# Patient Record
Sex: Female | Born: 1980 | Race: Black or African American | Hispanic: No | Marital: Single | Smoking: Current some day smoker
Health system: Southern US, Community
[De-identification: ages and names within clinical notes are randomized; demographics above are authoritative.]

## PROBLEM LIST (undated history)

## (undated) ENCOUNTER — Emergency Department (HOSPITAL_COMMUNITY): Admission: EM | Payer: Medicaid Other | Source: Home / Self Care

## (undated) DIAGNOSIS — F419 Anxiety disorder, unspecified: Secondary | ICD-10-CM

## (undated) DIAGNOSIS — F32A Depression, unspecified: Secondary | ICD-10-CM

## (undated) DIAGNOSIS — D219 Benign neoplasm of connective and other soft tissue, unspecified: Secondary | ICD-10-CM

## (undated) DIAGNOSIS — D649 Anemia, unspecified: Secondary | ICD-10-CM

## (undated) DIAGNOSIS — O009 Unspecified ectopic pregnancy without intrauterine pregnancy: Secondary | ICD-10-CM

## (undated) DIAGNOSIS — F319 Bipolar disorder, unspecified: Secondary | ICD-10-CM

## (undated) DIAGNOSIS — R519 Headache, unspecified: Secondary | ICD-10-CM

## (undated) HISTORY — DX: Benign neoplasm of connective and other soft tissue, unspecified: D21.9

## (undated) HISTORY — PX: BUNIONECTOMY: SHX129

---

## 2004-08-26 ENCOUNTER — Inpatient Hospital Stay: Payer: Self-pay

## 2005-01-24 ENCOUNTER — Emergency Department: Payer: Self-pay | Admitting: Emergency Medicine

## 2005-05-25 ENCOUNTER — Emergency Department: Payer: Self-pay | Admitting: Internal Medicine

## 2006-11-29 ENCOUNTER — Emergency Department: Payer: Self-pay | Admitting: Emergency Medicine

## 2007-02-27 ENCOUNTER — Encounter: Payer: Self-pay | Admitting: Maternal & Fetal Medicine

## 2007-03-27 ENCOUNTER — Encounter: Payer: Self-pay | Admitting: Obstetrics and Gynecology

## 2007-05-25 ENCOUNTER — Observation Stay: Payer: Self-pay

## 2007-06-25 ENCOUNTER — Observation Stay: Payer: Self-pay

## 2007-07-01 ENCOUNTER — Inpatient Hospital Stay: Payer: Self-pay

## 2007-08-23 ENCOUNTER — Emergency Department: Payer: Self-pay | Admitting: Internal Medicine

## 2008-01-25 ENCOUNTER — Emergency Department: Payer: Self-pay | Admitting: Emergency Medicine

## 2008-01-28 ENCOUNTER — Emergency Department: Payer: Self-pay | Admitting: Emergency Medicine

## 2008-11-22 ENCOUNTER — Emergency Department: Payer: Self-pay | Admitting: Emergency Medicine

## 2009-01-10 ENCOUNTER — Encounter: Payer: Self-pay | Admitting: Maternal & Fetal Medicine

## 2009-02-08 ENCOUNTER — Encounter: Payer: Self-pay | Admitting: Pediatric Cardiology

## 2009-02-21 ENCOUNTER — Encounter: Payer: Self-pay | Admitting: Maternal and Fetal Medicine

## 2009-03-20 ENCOUNTER — Observation Stay: Payer: Self-pay

## 2009-04-12 ENCOUNTER — Observation Stay: Payer: Self-pay | Admitting: Obstetrics & Gynecology

## 2009-05-02 ENCOUNTER — Inpatient Hospital Stay: Payer: Self-pay | Admitting: Obstetrics & Gynecology

## 2009-05-12 ENCOUNTER — Emergency Department: Payer: Self-pay | Admitting: Emergency Medicine

## 2009-05-17 ENCOUNTER — Emergency Department: Payer: Self-pay | Admitting: Emergency Medicine

## 2010-12-30 ENCOUNTER — Emergency Department: Payer: Self-pay | Admitting: Emergency Medicine

## 2011-02-05 ENCOUNTER — Encounter: Payer: Self-pay | Admitting: Maternal & Fetal Medicine

## 2011-03-23 ENCOUNTER — Observation Stay: Payer: Self-pay | Admitting: Obstetrics and Gynecology

## 2011-04-18 ENCOUNTER — Ambulatory Visit: Payer: Self-pay | Admitting: Oncology

## 2011-05-02 ENCOUNTER — Ambulatory Visit: Payer: Self-pay | Admitting: Oncology

## 2011-05-15 ENCOUNTER — Observation Stay: Payer: Self-pay

## 2011-05-18 ENCOUNTER — Observation Stay: Payer: Self-pay | Admitting: Obstetrics and Gynecology

## 2011-05-18 ENCOUNTER — Inpatient Hospital Stay: Payer: Self-pay

## 2011-05-19 ENCOUNTER — Ambulatory Visit: Payer: Self-pay | Admitting: Oncology

## 2012-04-26 ENCOUNTER — Emergency Department: Payer: Self-pay | Admitting: Internal Medicine

## 2012-04-26 LAB — CBC
MCHC: 32.9 g/dL (ref 32.0–36.0)
MCV: 85 fL (ref 80–100)
Platelet: 322 10*3/uL (ref 150–440)
RDW: 15.2 % — ABNORMAL HIGH (ref 11.5–14.5)
WBC: 7.8 10*3/uL (ref 3.6–11.0)

## 2012-04-26 LAB — WET PREP, GENITAL

## 2013-06-04 ENCOUNTER — Emergency Department: Payer: Self-pay | Admitting: Emergency Medicine

## 2013-06-04 LAB — COMPREHENSIVE METABOLIC PANEL
Albumin: 2.3 g/dL — ABNORMAL LOW (ref 3.4–5.0)
Anion Gap: 7 (ref 7–16)
BUN: 3 mg/dL — ABNORMAL LOW (ref 7–18)
Calcium, Total: 8 mg/dL — ABNORMAL LOW (ref 8.5–10.1)
Chloride: 108 mmol/L — ABNORMAL HIGH (ref 98–107)
EGFR (African American): >60
EGFR (Non-African Amer.): >60
Glucose: 86 mg/dL (ref 65–99)
Potassium: 2.9 mmol/L — ABNORMAL LOW (ref 3.5–5.1)

## 2013-06-04 LAB — CBC
HCT: 23.6 % — ABNORMAL LOW (ref 35.0–47.0)
HGB: 7 g/dL — ABNORMAL LOW (ref 12.0–16.0)
MCH: 21.4 pg — ABNORMAL LOW (ref 26.0–34.0)
MCHC: 29.9 g/dL — ABNORMAL LOW (ref 32.0–36.0)
MCV: 72 fL — ABNORMAL LOW (ref 80–100)
MCV: 72 fL — ABNORMAL LOW (ref 80–100)
Platelet: 321 10*3/uL (ref 150–440)
Platelet: 317 10*3/uL (ref 150–440)
RBC: 3.29 10*6/uL — ABNORMAL LOW (ref 3.80–5.20)
RDW: 18.3 % — ABNORMAL HIGH (ref 11.5–14.5)
RDW: 18.2 % — ABNORMAL HIGH (ref 11.5–14.5)

## 2013-06-04 LAB — HCG, QUANTITATIVE, PREGNANCY: Beta Hcg, Quant.: 10582 m[IU]/mL — ABNORMAL HIGH

## 2013-06-08 ENCOUNTER — Encounter: Payer: Self-pay | Admitting: Obstetrics and Gynecology

## 2013-06-19 ENCOUNTER — Ambulatory Visit: Payer: Self-pay | Admitting: Oncology

## 2013-06-29 ENCOUNTER — Ambulatory Visit: Payer: Self-pay | Admitting: Oncology

## 2013-07-13 ENCOUNTER — Observation Stay: Payer: Self-pay | Admitting: Advanced Practice Midwife

## 2013-07-18 ENCOUNTER — Ambulatory Visit: Payer: Self-pay | Admitting: Oncology

## 2013-08-31 ENCOUNTER — Inpatient Hospital Stay: Payer: Self-pay

## 2013-08-31 LAB — CBC WITH DIFFERENTIAL/PLATELET
Basophil %: 0.4 %
HCT: 36.7 % (ref 35.0–47.0)
Lymphocyte #: 1.7 10*3/uL (ref 1.0–3.6)
Lymphocyte %: 28.6 %
MCHC: 32 g/dL (ref 32.0–36.0)
MCV: 86 fL (ref 80–100)
Monocyte %: 10.2 %
Neutrophil #: 3.6 10*3/uL (ref 1.4–6.5)
Platelet: 227 10*3/uL (ref 150–440)
RDW: 17.4 % — ABNORMAL HIGH (ref 11.5–14.5)

## 2013-08-31 LAB — GC/CHLAMYDIA PROBE AMP

## 2013-09-01 LAB — HEMATOCRIT: HCT: 34 % — ABNORMAL LOW (ref 35.0–47.0)

## 2014-05-11 ENCOUNTER — Ambulatory Visit: Payer: Self-pay | Admitting: Obstetrics and Gynecology

## 2014-05-11 LAB — BASIC METABOLIC PANEL
ANION GAP: 5 — AB (ref 7–16)
BUN: 8 mg/dL (ref 7–18)
CALCIUM: 8.1 mg/dL — AB (ref 8.5–10.1)
CHLORIDE: 109 mmol/L — AB (ref 98–107)
Co2: 30 mmol/L (ref 21–32)
Creatinine: 0.76 mg/dL (ref 0.60–1.30)
EGFR (African American): >60
GLUCOSE: 78 mg/dL (ref 65–99)
Osmolality: 284 (ref 275–301)
POTASSIUM: 3.6 mmol/L (ref 3.5–5.1)
SODIUM: 144 mmol/L (ref 136–145)

## 2014-05-11 LAB — HEMOGLOBIN: HGB: 11.7 g/dL — AB (ref 12.0–16.0)

## 2015-01-07 NOTE — Consult Note (Signed)
Referral Information:  Reason for Referral 34 yo [place] [id number] AAF referred to [facility] perinatal due to grand multiparity, anemia , late care, h/o LEEP in March , h/o bipolar disorder off meds   Referring Physician [facility]   Prenatal Hx Ms Batterson underwent her first dating scan today suggesting she is 77 1/7 weeks. Pt reports she underwent a LEEP on 12/12/2012 and her pregnancy test was negative  and she abstained from sex after the LEEP. She believes she must have conceived immediately prior to the LEEP. She is embarrassed that she was drinking up through May but stopped shen she had suspicions that she might be pregnant by early June . She presented for care at the [facility] in late august/early september. She is concerned about her anemia  and started oral iron which caused nausea which took her to the ER early this month . She thinks her mood is stable - she received ehr bipolar diagnosis in 2010 and found the meds made her too sleepy . She and her partner think she is doing well andshe has suppor tfrom her mother - the kids are in school and the youngest are in daycare. She has received the name of a mental health provider but has not seen anyone in years.   Past Obstetrical Hx 2004 svd female 7 lb 2005 svd female 7 pounds 2008 spontaneous vaginal delivery 6 14 female  2010 female 7 lbs spontaneous vaginal delivery 2012 3380 gm spontaneous vaginal delivery female  2013 sab 3 months no D&C  last 3 pregnancies current partner   Home Medications: Medication Instructions Status  ferrous sulfate 325 mg (65 mg elemental iron) oral tablet 1 tab(s) orally once a day Active   Allergies:   No Known Allergies:   Vital Signs/Notes:  Nursing Vital Signs: **Vital Signs.:   22-Sep-14 13:10  Vital Signs Type Routine; Perinatal clinic  Temperature Temperature (F) 97.9  Celsius 36.6  Pulse Pulse 94  Respirations Respirations 18  Systolic BP Systolic BP 557  Diastolic BP (mmHg) Diastolic BP (mmHg) 59  Mean BP 72   Pulse Ox % Pulse Ox % 99  Oxygen Delivery Room Air/ 21 %   Perinatal Consult:  PGyn Hx abnormal Paps  LEEP  cin 2   Past Medical History cont'd Anemia - also in last pregnancy - HGB 7.1/24% ferritin 11 TIBC 496 iron 15 now tolerating oral iron, h/o pica , neg sickle screen Bipolar - no meds never hospitalized, no h/o suicide gesture , unclear if worse postpartum for her , no current provider  ETOH - pt denies current use as does father of baby - thoguh she suggested she stopped in may or june ,[facility] chart says had a drink in August neg tox screen , distant Marijuana use   PSurg Hx LEEP, bunionectomy   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- usually volunteers at [employer] too weak now   Occupation Father industrial work   Soc Hx committed relationship   Review Of Systems:  Subjective tired   Medications/Allergies Reviewed Medications/Allergies reviewed   Impression/Recommendations:  Impression IUP at 28 1/7 weeks dated by today's scan  1- late care 2 grand multip all live with her, support form partner and her mom, increased risk of PPH 3 anemia - iron deficiency on oral iron 4 h/o bipolar no meds , no ongoing provider , denies any suicidal thoughts now or ever, meds make her sleepy unable to care for her kids  5 ethanol exposure declines genetic counseling - denies ongoing  problem 6 recent LEEP for CIN 2   Recommendations 1 Follow up scan in 4 weeks to assess growth given late care 2 wants Nexplanon for contraception - not ready for BTL 3 I recommended her PNV plus 2 additional iron tablets , iron rich foods - advised colace  to prevent constipation, if unable to bring up Hgb consider iv iron 4- if still anemic at delivery consider active management of third stage with oxytocin and cytotec per rectum after placental delivery, type and cross 2 units PRBCs  in active labor 5- needs f/u pap  6 Offer SW and mental health referral - seems well compensated now, would accept advice of  her family if they suggested she needed help   Plan:  Genetic Counseling no, offered due to ETOH   Prenatal Diagnosis Options u/s done   Ultrasound at what gestational ages in 4 weeks   Additional Testing Folate/prenatal vitamins, plus iron bid    Total Time Spent with Patient 15 minutes   >50% of visit spent in couseling/coordination of care yes   Office Use Only [address]  Level 1 (73min) NEW office consult prob focused   Coding Description: MATERNAL CONDITIONS/HISTORY INDICATION(S).   Anemia.   Drug Use:  Alcohol or Cocaine.   OTHER: bipolar.  Electronic Signatures: Sharyn Creamer (MD)  (Signed 22-Sep-14 16:38)  Authored: Referral, Home Medications, Allergies, Vital Signs/Notes, Consult, Exam, Impression, Plan, Billing, Coding Description   Last Updated: 22-Sep-14 16:38 by Sharyn Creamer (MD)

## 2015-01-25 NOTE — H&P (Signed)
L&D Evaluation:  History Expanded:  HPI [facility] patuient who is seeing [facility] for problems in pregnancy and history 34 yo at 72 w 1 days who presented woth contractions last night. she is 7 cm. she is using ETOH and is a grandmultip, she has PICA and she has severe anemeiaher pap smear shows CIS this pregnancy, she had a referrla to [facility] 06/10/13/  but no results awre available, due to severe anemia [facility] had recommended active management of third stage of labor  with immediate start of pitocin after clamping cord and rectal cytotec at that time also, shse is GBS neg, got tdap on 06/30/13 has had LEEP on 12/12/12 with pos margins, late entry to care, andhas psych issues, she is O POS, RUBI, VZI  with normal OGTT   Gravida 7   Term 5   PreTerm 0   Abortion 1   Living 5   Blood Type (Maternal) O positive   Group B Strep Results Maternal (Result >5wks must be treated as unknown) negative   Maternal HIV Negative   Maternal Syphilis Ab Nonreactive   Maternal Varicella Immune   Rubella Results (Maternal) immune   Maternal T-Dap Immune   EDC 30-Aug-2013   Presents with contractions, labor, nitrazine neg but possible hair on exam.   Patient's Medical History CIS of cervix, etahism, PICA, anemia, psych issues,   Medications Pre Natal Vitamins   Allergies NKDA   Family History Non-Contributory   Current Prenatal Course Notable For late entry to care, etohism, psych issues,   ROS:  ROS All systems were reviewed.  HEENT, CNS, GI, GU, Respiratory, CV, Renal and Musculoskeletal systems were found to be normal.   Exam:  Vital Signs stable   Urine Protein not completed   General no apparent distress   Mental Status clear   Chest clear   Heart normal sinus rhythm   Abdomen gravid, tender with contractions   Estimated Fetal Weight Average for gestational age   Fetal Position vertex   Back no CVAT   Edema no edema   Reflexes 1+   Pelvic no external lesions    Mebranes Intact, but truly unsure   Description clear   FHT normal rate with no decels   Fetal Heart Rate 140   Ucx regular   Ucx Frequency 2 min   Skin dry   Lymph no lymphadenopathy   Impression:  Impression active labor   Plan:  Plan UA   Comments if suspect rupture will add antibiotics and anticipate SVD   Follow Up Appointment need to schedule. in 4 weeks   Electronic Signatures: Erik Obey (MD)  (Signed 15-Dec-14 08:04)  Authored: L&D Evaluation   Last Updated: 15-Dec-14 08:04 by Erik Obey (MD)
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
## 2015-05-13 ENCOUNTER — Emergency Department
Admission: EM | Admit: 2015-05-13 | Discharge: 2015-05-13 | Disposition: A | Discharge status: Home / Self Care | Payer: Self-pay | Attending: Emergency Medicine | Admitting: Emergency Medicine

## 2015-05-13 DIAGNOSIS — Y9289 Other specified places as the place of occurrence of the external cause: Secondary | ICD-10-CM | POA: Insufficient documentation

## 2015-05-13 DIAGNOSIS — W57XXXA Bitten or stung by nonvenomous insect and other nonvenomous arthropods, initial encounter: Secondary | ICD-10-CM | POA: Insufficient documentation

## 2015-05-13 DIAGNOSIS — Y998 Other external cause status: Secondary | ICD-10-CM | POA: Insufficient documentation

## 2015-05-13 DIAGNOSIS — S80862A Insect bite (nonvenomous), left lower leg, initial encounter: Secondary | ICD-10-CM | POA: Insufficient documentation

## 2015-05-13 DIAGNOSIS — L301 Dyshidrosis [pompholyx]: Secondary | ICD-10-CM | POA: Insufficient documentation

## 2015-05-13 DIAGNOSIS — Z87891 Personal history of nicotine dependence: Secondary | ICD-10-CM | POA: Insufficient documentation

## 2015-05-13 DIAGNOSIS — Y9389 Activity, other specified: Secondary | ICD-10-CM | POA: Insufficient documentation

## 2015-05-13 DIAGNOSIS — L089 Local infection of the skin and subcutaneous tissue, unspecified: Secondary | ICD-10-CM

## 2015-05-13 HISTORY — DX: Anemia, unspecified: D64.9

## 2015-05-13 MED ORDER — IBUPROFEN 800 MG PO TABS: 800.0000 mg | ORAL_TABLET | Freq: Three times a day (TID) | ORAL | Status: DC | PRN

## 2015-05-13 MED ORDER — HYDROCORTISONE VALERATE 0.2 % EX OINT: 1.0000 "application " | TOPICAL_OINTMENT | Freq: Two times a day (BID) | CUTANEOUS | Status: DC

## 2015-05-13 MED ORDER — SULFAMETHOXAZOLE-TRIMETHOPRIM 800-160 MG PO TABS: 1.0000 | ORAL_TABLET | Freq: Two times a day (BID) | ORAL | Status: DC

## 2015-05-13 NOTE — ED Notes (Signed)
Both lower legs swollen with redness   States she noticed an insect bite on Tuesday.

## 2015-05-13 NOTE — ED Provider Notes (Signed)
[facility] Emergency Department Provider Note  ____________________________________________  Time seen: Approximately 12:58 PM  I have reviewed the triage vital signs and the nursing notes.   HISTORY  Chief Complaint Insect Bite    HPI Lindsey Hunt is a 34 y.o. female patient complaining of erythema edema to bilateral legs status post insect bite which happened 3 days ago. Patient state that since the bite she been trying to express any purulent material out of the sites. Patient denies any fever associated with this complaint. Patient rated her discomfort as a 9/10. Patient also complaining of a rash to the palmar aspects of her hands. Patient states she's had these episodes on her hands in the past and also her feet.  Past Medical History  Diagnosis Date  . Anemia     There are no active problems to display for this patient.   Past Surgical History  Procedure Laterality Date  . Bunionectomy Left     Current Outpatient Rx  Name  Route  Sig  Dispense  Refill  . ferrous sulfate 325 (65 FE) MG tablet   Oral   Take 325 mg by mouth daily with breakfast.           Allergies Review of patient's allergies indicates no known allergies.  No family history on file.  Social History Social History  Substance Use Topics  . Smoking status: Former Research scientist (life sciences)  . Smokeless tobacco: Never Used  . Alcohol Use: No    Review of Systems Constitutional: No fever/chills Eyes: No visual changes. ENT: No sore throat. Cardiovascular: Denies chest pain. Respiratory: Denies shortness of breath. Gastrointestinal: No abdominal pain.  No nausea, no vomiting.  No diarrhea.  No constipation. Genitourinary: Negative for dysuria. Musculoskeletal: Negative for back pain. Skin: Rash on hands and feet. Erythematous bilateral lower legs. Neurological: Negative for headaches, focal weakness or numbness. Hematological/Lymphatic:Anemia 10-point ROS otherwise  negative.  ____________________________________________   PHYSICAL EXAM:  VITAL SIGNS: ED Triage Vitals  Enc Vitals Group     BP 05/13/15 1239 117/60 mmHg     Pulse Rate 05/13/15 1239 87     Resp 05/13/15 1239 17     Temp 05/13/15 1239 98.2 F (36.8 C)     Temp Source 05/13/15 1239 Oral     SpO2 05/13/15 1239 99 %     Weight 05/13/15 1239 182 lb (82.555 kg)     Height 05/13/15 1239 5\' 6"  (1.676 m)     Head Cir --      Peak Flow --      Pain Score 05/13/15 1240 9     Pain Loc --      Pain Edu? --      Excl. in [place]? --     Constitutional: Alert and oriented. Well appearing and in no acute distress. Eyes: Conjunctivae are normal. PERRL. EOMI. Head: Atraumatic. Nose: No congestion/rhinnorhea. Mouth/Throat: Mucous membranes are moist.  Oropharynx non-erythematous. Neck: No stridor. No cervical spine tenderness to palpation. Hematological/Lymphatic/Immunilogical: No cervical lymphadenopathy. Cardiovascular: Normal rate, regular rhythm. Grossly normal heart sounds.  Good peripheral circulation. Respiratory: Normal respiratory effort.  No retractions. Lungs CTAB. Gastrointestinal: Soft and nontender. No distention. No abdominal bruits. No CVA tenderness. Musculoskeletal: No lower extremity tenderness nor edema.  No joint effusions. Neurologic:  Normal speech and language. No gross focal neurologic deficits are appreciated. No gait instability. Skin:  Skin is warm, dry and intact. Papular lesion on erythematous base lateral legs. Eczema lesions on the hands and feet.  Psychiatric: Mood and affect are normal. Speech and behavior are normal.  ____________________________________________   LABS (all labs ordered are listed, but only abnormal results are displayed)  Labs Reviewed - No data to  display ____________________________________________  EKG   ____________________________________________  RADIOLOGY   ____________________________________________   PROCEDURES  Procedure(s) performed: None  Critical Care performed: No  ____________________________________________   INITIAL IMPRESSION / ASSESSMENT AND PLAN / ED COURSE  Pertinent labs & imaging results that were available during my care of the patient were reviewed by me and considered in my medical decision making (see chart for details).  Infected insect bite bilateral legs. Dyshidrotic eczema of hands and feet. Patient given prescription for Bactrim and ibuprofen to take as directed. Patient also given a prescription for Westcort applied to the eczema lesions. [facility]. ____________________________________________   FINAL CLINICAL IMPRESSION(S) / ED DIAGNOSES  Final diagnoses:  Infected insect bite of left leg, initial encounter  Dyshidrotic eczema      Sable Feil, PA-C 05/13/15 1316  Ahmed Prima, MD 05/13/15 [phone]

## 2015-05-13 NOTE — ED Notes (Signed)
Pt c/o redness and swelling of the BL LE since Wednesday..she thinks something bit her

## 2015-10-23 ENCOUNTER — Emergency Department
Admission: EM | Admit: 2015-10-23 | Discharge: 2015-10-23 | Disposition: A | Discharge status: Home / Self Care | Payer: Medicaid Other | Attending: Emergency Medicine | Admitting: Emergency Medicine

## 2015-10-23 DIAGNOSIS — R222 Localized swelling, mass and lump, trunk: Secondary | ICD-10-CM | POA: Diagnosis present

## 2015-10-23 DIAGNOSIS — Z79899 Other long term (current) drug therapy: Secondary | ICD-10-CM | POA: Diagnosis not present

## 2015-10-23 DIAGNOSIS — Z87891 Personal history of nicotine dependence: Secondary | ICD-10-CM | POA: Insufficient documentation

## 2015-10-23 DIAGNOSIS — L02212 Cutaneous abscess of back [any part, except buttock]: Secondary | ICD-10-CM

## 2015-10-23 MED ORDER — HYDROCODONE-ACETAMINOPHEN 5-325 MG PO TABS: 1.0000 | ORAL_TABLET | ORAL | Status: DC | PRN

## 2015-10-23 MED ORDER — LIDOCAINE-EPINEPHRINE (PF) 1 %-1:200000 IJ SOLN
INTRAMUSCULAR | Status: AC
  Filled 2015-10-23: qty 30

## 2015-10-23 MED ORDER — SULFAMETHOXAZOLE-TRIMETHOPRIM 800-160 MG PO TABS: 2.0000 | ORAL_TABLET | Freq: Two times a day (BID) | ORAL | Status: AC

## 2015-10-23 NOTE — ED Provider Notes (Signed)
CSN: [id number]     Arrival date & time 10/23/15  1405 History   First MD Initiated Contact with Patient 10/23/15 1425     Chief Complaint  Patient presents with  . Abscess    HPI Comments: 35 year old female presents today complaining of red, swollen area to left shoulder for the past 3-4 days. Pt reports it have become larger and more painful as time goes on. No fevers, sweats or chills. Has not applied any creams to it or tried to open the boil herself. Has had these in the past and was treated with incision and drainage. Has not been diagnosed with MRSA.   The history is provided by the patient.    Past Medical History  Diagnosis Date  . Anemia    Past Surgical History  Procedure Laterality Date  . Bunionectomy Left    No family history on file. Social History  Substance Use Topics  . Smoking status: Former Research scientist (life sciences)  . Smokeless tobacco: Never Used  . Alcohol Use: No   OB History    No data available     Review of Systems  Constitutional: Negative for fever and chills.  Musculoskeletal: Negative for myalgias and arthralgias.  Skin: Positive for wound.  All other systems reviewed and are negative.     Allergies  Review of patient's allergies indicates no known allergies.  Home Medications   Prior to Admission medications   Medication Sig Start Date End Date Taking? Authorizing Provider  ferrous sulfate 325 (65 FE) MG tablet Take 325 mg by mouth daily with breakfast.    Historical Provider, MD  HYDROcodone-acetaminophen (NORCO) 5-325 MG tablet Take 1 tablet by mouth every 4 (four) hours as needed for moderate pain. 10/23/15   Harvest Dark, PA-C  hydrocortisone valerate ointment (WESTCORT) 0.2 % Apply 1 application topically 2 (two) times daily. 05/13/15   Sable Feil, PA-C  sulfamethoxazole-trimethoprim (BACTRIM DS,SEPTRA DS) 800-160 MG tablet Take 2 tablets by mouth 2 (two) times daily. 10/23/15 11/02/15  Angelica Ran V, PA-C   BP 119/61 mmHg  Pulse 83   Temp(Src) 99 F (37.2 C) (Oral)  Resp 18  Ht 5\' 6"  (1.676 m)  Wt 83.915 kg  BMI 29.87 kg/m2  SpO2 100% Physical Exam  Constitutional: She is oriented to person, place, and time. Vital signs are normal. She appears well-developed and well-nourished. She is active.  Non-toxic appearance. She does not have a sickly appearance. She does not appear ill.  HENT:  Head: Normocephalic and atraumatic.  Neurological: She is alert and oriented to person, place, and time.  Skin: There is erythema.     Psychiatric: She has a normal mood and affect. Her behavior is normal. Judgment and thought content normal.  Nursing note and vitals reviewed.   ED Course  .Marland KitchenIncision and Drainage Date/Time: 10/23/2015 2:50 PM Performed by: Harvest Dark Authorized by: Harvest Dark Consent: Verbal consent obtained. Risks and benefits: risks, benefits and alternatives were discussed Consent given by: patient Patient understanding: patient states understanding of the procedure being performed Patient consent: the patient's understanding of the procedure matches consent given Procedure consent: procedure consent matches procedure scheduled Relevant documents: relevant documents present and verified Test results: test results available and properly labeled Site marked: the operative site was marked Imaging studies: imaging studies available Required items: required blood products, implants, devices, and special equipment available Patient identity confirmed: verbally with patient Time out: Immediately prior to procedure a "time out" was called to  verify the correct patient, procedure, equipment, support staff and site/side marked as required. Type: abscess Body area: trunk Location details: back Anesthesia: local infiltration Local anesthetic: lidocaine 1% with epinephrine Anesthetic total: 6 ml Patient sedated: no Scalpel size: 11 Incision type: elliptical Incision depth: dermal Complexity:  simple Drainage: purulent Drainage amount: copious Wound treatment: drain placed Packing material: 1/4 in iodoform gauze Patient tolerance: Patient tolerated the procedure well with no immediate complications   (including critical care time) Labs Review Labs Reviewed - No data to display  Imaging Review No results found. I have personally reviewed and evaluated these images and lab results as part of my medical decision-making.   EKG Interpretation None      MDM  WOund drained as above Bactrim DS 2 tabs PO BID x 10 days Norco 5/325 q4h PRN pain, do not take before working or driving Follow up in 2 days for wound check  Final diagnoses:  Abscess of upper back excluding scapular region        Harvest Dark, PA-C 10/23/15 [place], PA-C 10/23/15 1504  Carrie Mew, MD 10/23/15 1520

## 2015-10-23 NOTE — ED Notes (Signed)
Pt reports red swollen area to back that started Thursday. Denies drainage

## 2015-10-23 NOTE — Discharge Instructions (Signed)

## 2017-01-07 ENCOUNTER — Ambulatory Visit: Payer: Medicaid Other | Admitting: Family Medicine

## 2017-01-07 ENCOUNTER — Encounter: Payer: Self-pay | Admitting: Family Medicine

## 2017-03-28 ENCOUNTER — Encounter (HOSPITAL_COMMUNITY): Payer: Self-pay | Admitting: Emergency Medicine

## 2017-03-28 ENCOUNTER — Ambulatory Visit (HOSPITAL_COMMUNITY)
Admission: EM | Admit: 2017-03-28 | Discharge: 2017-03-28 | Disposition: A | Discharge status: Home / Self Care | Payer: Medicaid Other | Attending: Emergency Medicine | Admitting: Emergency Medicine

## 2017-03-28 DIAGNOSIS — R21 Rash and other nonspecific skin eruption: Secondary | ICD-10-CM

## 2017-03-28 DIAGNOSIS — L239 Allergic contact dermatitis, unspecified cause: Secondary | ICD-10-CM | POA: Diagnosis not present

## 2017-03-28 DIAGNOSIS — L259 Unspecified contact dermatitis, unspecified cause: Secondary | ICD-10-CM

## 2017-03-28 MED ORDER — HYDROXYZINE HCL 25 MG PO TABS: 25.0000 mg | ORAL_TABLET | Freq: Four times a day (QID) | ORAL | 0 refills | Status: DC

## 2017-03-28 MED ORDER — METHYLPREDNISOLONE 4 MG PO TBPK: ORAL_TABLET | ORAL | 0 refills | Status: DC

## 2017-03-28 NOTE — ED Provider Notes (Signed)
CSN: [id number]     Arrival date & time 03/28/17  1023 History   None    Chief Complaint  Patient presents with  . Rash   (Consider location/radiation/quality/duration/timing/severity/associated sxs/prior Treatment) Patient c/o itching and rash on left hand which started today.   The history is provided by the patient.  Rash  Location:  Hand Hand rash location:  L hand Quality: itchiness and redness   Severity:  Mild Onset quality:  Sudden Duration:  1 day Timing:  Constant Progression:  Spreading Chronicity:  New   Past Medical History:  Diagnosis Date  . Anemia    Past Surgical History:  Procedure Laterality Date  . BUNIONECTOMY Left    No family history on file. Social History  Substance Use Topics  . Smoking status: Former Research scientist (life sciences)  . Smokeless tobacco: Never Used  . Alcohol use No   OB History    No data available     Review of Systems  Constitutional: Negative.   HENT: Negative.   Eyes: Negative.   Respiratory: Negative.   Cardiovascular: Negative.   Gastrointestinal: Negative.   Endocrine: Negative.   Genitourinary: Negative.   Musculoskeletal: Negative.   Skin: Positive for rash.  Allergic/Immunologic: Negative.   Neurological: Negative.   Hematological: Negative.   Psychiatric/Behavioral: Negative.     Allergies  Patient has no known allergies.  Home Medications   Prior to Admission medications   Medication Sig Start Date End Date Taking? Authorizing Provider  OVER THE COUNTER MEDICATION Birth control pills (cannot remember the name)   Yes [provider]  ferrous sulfate 325 (65 FE) MG tablet Take 325 mg by mouth daily with breakfast.    [provider]  HYDROcodone-acetaminophen (NORCO) 5-325 MG tablet Take 1 tablet by mouth every 4 (four) hours as needed for moderate pain. 10/23/15   Harvest Dark, PA-C  hydrocortisone valerate ointment (WESTCORT) 0.2 % Apply 1 application topically 2 (two) times daily. 05/13/15    Sable Feil, PA-C  hydrOXYzine (ATARAX/VISTARIL) 25 MG tablet Take 1 tablet (25 mg total) by mouth every 6 (six) hours. 03/28/17   Lysbeth Penner, FNP  methylPREDNISolone (MEDROL DOSEPAK) 4 MG TBPK tablet Take 6-5-4-3-2-1- po qd 03/28/17   Lysbeth Penner, FNP   Meds Ordered and Administered this Visit  Medications - No data to display  BP 131/85 (BP Location: Left Arm)   Pulse 62   Temp 98.1 F (36.7 C) (Oral)   Resp 18   SpO2 99%  No data found.   Physical Exam  Constitutional: She appears well-developed and well-nourished.  HENT:  Head: Normocephalic and atraumatic.  Eyes: Pupils are equal, round, and reactive to light. Conjunctivae and EOM are normal.  Neck: Normal range of motion. Neck supple.  Cardiovascular: Normal rate, regular rhythm and normal heart sounds.   Pulmonary/Chest: Effort normal and breath sounds normal.  Abdominal: Soft. Bowel sounds are normal.  Skin: Rash noted.  Nursing note and vitals reviewed.   Urgent Care Course     Procedures (including critical care time)  Labs Review Labs Reviewed - No data to display  Imaging Review No results found.   Visual Acuity Review  Right Eye Distance:   Left Eye Distance:   Bilateral Distance:    Right Eye Near:   Left Eye Near:    Bilateral Near:         MDM   1. Contact dermatitis, unspecified contact dermatitis type, unspecified trigger    Medrol dose pack  as directed 4mg  #21 Hydroxyzine  Work note      Lysbeth Penner, [place] 03/28/17 1857

## 2017-03-28 NOTE — ED Triage Notes (Signed)
Noticed rash today.  Reports she noticed extra itching yesterday.  Left hand soreness

## 2018-11-17 ENCOUNTER — Emergency Department (HOSPITAL_COMMUNITY)
Admission: EM | Admit: 2018-11-17 | Discharge: 2018-11-18 | Disposition: A | Discharge status: Home / Self Care | Payer: Self-pay | Attending: Emergency Medicine | Admitting: Emergency Medicine

## 2018-11-17 ENCOUNTER — Other Ambulatory Visit: Payer: Self-pay

## 2018-11-17 ENCOUNTER — Encounter (HOSPITAL_COMMUNITY): Payer: Self-pay

## 2018-11-17 DIAGNOSIS — J0100 Acute maxillary sinusitis, unspecified: Secondary | ICD-10-CM | POA: Insufficient documentation

## 2018-11-17 DIAGNOSIS — Z87891 Personal history of nicotine dependence: Secondary | ICD-10-CM | POA: Insufficient documentation

## 2018-11-17 DIAGNOSIS — J069 Acute upper respiratory infection, unspecified: Secondary | ICD-10-CM | POA: Insufficient documentation

## 2018-11-17 DIAGNOSIS — R0789 Other chest pain: Secondary | ICD-10-CM | POA: Insufficient documentation

## 2018-11-17 DIAGNOSIS — R0981 Nasal congestion: Secondary | ICD-10-CM | POA: Insufficient documentation

## 2018-11-17 DIAGNOSIS — J3489 Other specified disorders of nose and nasal sinuses: Secondary | ICD-10-CM | POA: Insufficient documentation

## 2018-11-17 NOTE — ED Triage Notes (Signed)
Pt here with a cough for the last 4 days.  Non productive cough.  A&Ox4.

## 2018-11-18 MED ORDER — BENZONATATE 100 MG PO CAPS: 100.0000 mg | ORAL_CAPSULE | Freq: Three times a day (TID) | ORAL | 0 refills | Status: DC

## 2018-11-18 MED ORDER — FLUTICASONE PROPIONATE 50 MCG/ACT NA SUSP: 1.0000 | Freq: Every day | NASAL | 0 refills | Status: DC

## 2018-11-18 MED ORDER — NAPROXEN 500 MG PO TABS: 500.0000 mg | ORAL_TABLET | Freq: Two times a day (BID) | ORAL | 0 refills | Status: DC

## 2018-11-18 MED ORDER — PROMETHAZINE-DM 6.25-15 MG/5ML PO SYRP: 5.0000 mL | ORAL_SOLUTION | Freq: Four times a day (QID) | ORAL | 0 refills | Status: DC | PRN

## 2018-11-18 NOTE — Discharge Instructions (Addendum)
You likely have a viral illness.  This should be treated symptomatically. Take naproxen 2 times a day with meals as needed for pain.  Do not take other anti-inflammatories at the same time (Advil, Motrin, ibuprofen, Aleve). You may supplement with Tylenol if you need further pain control. Use Flonase daily for nasal congestion and cough. Use cough medications to help decrease cough.  Make sure you stay well-hydrated with water. Wash your hands frequently to prevent spread of infection. Return to the emergency room if you develop chest pain, difficulty breathing, or any new or worsening symptoms.

## 2018-11-18 NOTE — ED Provider Notes (Signed)
[facility] EMERGENCY DEPARTMENT Provider Note   CSN: [id number] Arrival date & time: 11/17/18  2302    History   Chief Complaint Chief Complaint  Patient presents with  . Cough    HPI Lindsey Hunt is a 38 y.o. female presenting for evaluation of cough, nasal congestion, and sinus pressure.  Patient states that the past 5 days, she has been having URI symptoms.  She reports nasal congestion and sinus pressure on the right side.  Patient reports a cough which is occasionally productive.  When she coughs, she reports chest pain that streaks of parts her chest.  No pain at rest.  She denies fevers, chills, ear pain, sore throat, shortness of breath, nausea, vomiting, dumping, urinary symptoms, normal bowel movements.  She denies history of asthma or COPD.  She denies tobacco use.  Patient states her nephew's fianc was recently sick with the same, no other sick contacts.  She has no medical problems, takes no medications daily.  She has been taking Alka-Seltzer and TheraFlu without improvement of symptoms.  She has not tried anything for her pain.     HPI  Past Medical History:  Diagnosis Date  . Anemia     There are no active problems to display for this patient.   Past Surgical History:  Procedure Laterality Date  . BUNIONECTOMY Left      OB History   No obstetric history on file.      Home Medications    Prior to Admission medications   Medication Sig Start Date End Date Taking? Authorizing Provider  benzonatate (TESSALON) 100 MG capsule Take 1 capsule (100 mg total) by mouth every 8 (eight) hours. 11/18/18   Berdena Cisek, PA-C  ferrous sulfate 325 (65 FE) MG tablet Take 325 mg by mouth daily with breakfast.    [provider]  fluticasone (FLONASE) 50 MCG/ACT nasal spray Place 1 spray into both nostrils daily. 11/18/18   Maebry Obrien, PA-C  HYDROcodone-acetaminophen (NORCO) 5-325 MG tablet Take 1 tablet by mouth every 4 (four)  hours as needed for moderate pain. 10/23/15   Harvest Dark, PA-C  hydrocortisone valerate ointment (WESTCORT) 0.2 % Apply 1 application topically 2 (two) times daily. 05/13/15   Sable Feil, PA-C  hydrOXYzine (ATARAX/VISTARIL) 25 MG tablet Take 1 tablet (25 mg total) by mouth every 6 (six) hours. 03/28/17   Lysbeth Penner, FNP  methylPREDNISolone (MEDROL DOSEPAK) 4 MG TBPK tablet Take 6-5-4-3-2-1- po qd 03/28/17   Lysbeth Penner, FNP  naproxen (NAPROSYN) 500 MG tablet Take 1 tablet (500 mg total) by mouth 2 (two) times daily with a meal. 11/18/18   Jannelly Bergren, PA-C  OVER THE COUNTER MEDICATION Birth control pills (cannot remember the name)    [provider]  promethazine-dextromethorphan (PROMETHAZINE-DM) 6.25-15 MG/5ML syrup Take 5 mLs by mouth 4 (four) times daily as needed. 11/18/18   Elga Santy, PA-C    Family History History reviewed. No pertinent family history.  Social History Social History   Tobacco Use  . Smoking status: Former Research scientist (life sciences)  . Smokeless tobacco: Never Used  Substance Use Topics  . Alcohol use: No  . Drug use: No     Allergies   Patient has no known allergies.   Review of Systems Review of Systems  HENT: Positive for congestion, sinus pressure and sinus pain.   Respiratory: Positive for cough.   All other systems reviewed and are negative.    Physical Exam Updated Vital Signs  BP (!) 124/91 (BP Location: Right Arm)   Pulse 61   Temp 97.9 F (36.6 C) (Oral)   SpO2 100%   Physical Exam Vitals signs and nursing note reviewed.  Constitutional:      General: She is not in acute distress.    Appearance: She is well-developed.     Comments: Sitting in the bed in NAD  HENT:     Head: Normocephalic and atraumatic.     Comments: OP clear without tonsillar swelling redness today.  Uvula midline with equal palate rise.  TMs nonerythematous nonbulging bilaterally. No dental pain Nasal congestion noted on the right side.   Tenderness palpation of the right maxillary sinus.    Right Ear: Tympanic membrane, ear canal and external ear normal.     Left Ear: Tympanic membrane, ear canal and external ear normal.     Nose: Mucosal edema present.     Right Sinus: Maxillary sinus tenderness present. No frontal sinus tenderness.     Left Sinus: No maxillary sinus tenderness or frontal sinus tenderness.     Mouth/Throat:     Pharynx: Uvula midline.     Tonsils: No tonsillar exudate.  Eyes:     Conjunctiva/sclera: Conjunctivae normal.     Pupils: Pupils are equal, round, and reactive to light.  Neck:     Musculoskeletal: Normal range of motion.  Cardiovascular:     Rate and Rhythm: Normal rate and regular rhythm.     Pulses: Normal pulses.  Pulmonary:     Effort: Pulmonary effort is normal.     Breath sounds: Normal breath sounds. No decreased breath sounds, wheezing, rhonchi or rales.     Comments: Speaking in full sentences.  Clear lung sounds in all fields. Abdominal:     General: There is no distension.     Palpations: Abdomen is soft. There is no mass.     Tenderness: There is no abdominal tenderness. There is no guarding or rebound.  Musculoskeletal: Normal range of motion.  Lymphadenopathy:     Cervical: No cervical adenopathy.  Skin:    General: Skin is warm.     Capillary Refill: Capillary refill takes less than 2 seconds.  Neurological:     Mental Status: She is alert and oriented to person, place, and time.      ED Treatments / Results  Labs (all labs ordered are listed, but only abnormal results are displayed) Labs Reviewed - No data to display  EKG None  Radiology No results found.  Procedures Procedures (including critical care time)  Medications Ordered in ED Medications - No data to display   Initial Impression / Assessment and Plan / ED Course  I have reviewed the triage vital signs and the nursing notes.  Pertinent labs & imaging results that were available during my care  of the patient were reviewed by me and considered in my medical decision making (see chart for details).        Patient presenting with 5 day h/o URI symptoms.  Physical exam reassuring, patient is afebrile and appears nontoxic.  Pulmonary exam reassuring.  Doubt pneumonia, strep, other bacterial infection, or peritonsillar abscess.  Likely viral URI, consider sinusitis.  Will treat symptomatically.  Patient to follow-up as needed.  At this time, patient appears safe for discharge.  Return precautions given.  Patient states she understands and agrees to plan.  Final Clinical Impressions(s) / ED Diagnoses   Final diagnoses:  Upper respiratory tract infection, unspecified type  Acute maxillary  sinusitis, recurrence not specified    ED Discharge Orders         Ordered    benzonatate (TESSALON) 100 MG capsule  Every 8 hours     11/18/18 0356    naproxen (NAPROSYN) 500 MG tablet  2 times daily with meals     11/18/18 0356    promethazine-dextromethorphan (PROMETHAZINE-DM) 6.25-15 MG/5ML syrup  4 times daily PRN     11/18/18 0356    fluticasone (FLONASE) 50 MCG/ACT nasal spray  Daily     11/18/18 0356           Franchot Heidelberg, PA-C 11/18/18 0403    Palumbo, April, MD 11/18/18 6282

## 2019-04-05 ENCOUNTER — Emergency Department: Payer: No Typology Code available for payment source

## 2019-04-05 ENCOUNTER — Emergency Department
Admission: EM | Admit: 2019-04-05 | Discharge: 2019-04-05 | Disposition: A | Discharge status: Home / Self Care | Payer: No Typology Code available for payment source | Attending: Emergency Medicine | Admitting: Emergency Medicine

## 2019-04-05 ENCOUNTER — Other Ambulatory Visit: Payer: Self-pay

## 2019-04-05 ENCOUNTER — Encounter: Payer: Self-pay | Admitting: Emergency Medicine

## 2019-04-05 DIAGNOSIS — Y998 Other external cause status: Secondary | ICD-10-CM | POA: Diagnosis not present

## 2019-04-05 DIAGNOSIS — Y9389 Activity, other specified: Secondary | ICD-10-CM | POA: Insufficient documentation

## 2019-04-05 DIAGNOSIS — Z87891 Personal history of nicotine dependence: Secondary | ICD-10-CM | POA: Diagnosis not present

## 2019-04-05 DIAGNOSIS — M542 Cervicalgia: Secondary | ICD-10-CM | POA: Diagnosis not present

## 2019-04-05 DIAGNOSIS — Y92410 Unspecified street and highway as the place of occurrence of the external cause: Secondary | ICD-10-CM | POA: Diagnosis not present

## 2019-04-05 DIAGNOSIS — R109 Unspecified abdominal pain: Secondary | ICD-10-CM | POA: Diagnosis not present

## 2019-04-05 LAB — CBC WITH DIFFERENTIAL/PLATELET
Abs Immature Granulocytes: 0.02 10*3/uL (ref 0.00–0.07)
Basophils Absolute: 0 10*3/uL (ref 0.0–0.1)
Basophils Relative: 0 %
Eosinophils Absolute: 0.5 10*3/uL (ref 0.0–0.5)
Eosinophils Relative: 7 %
HCT: 35.7 % — ABNORMAL LOW (ref 36.0–46.0)
Hemoglobin: 10.6 g/dL — ABNORMAL LOW (ref 12.0–15.0)
Immature Granulocytes: 0 %
Lymphocytes Relative: 33 %
Lymphs Abs: 2.4 10*3/uL (ref 0.7–4.0)
MCH: 24.3 pg — ABNORMAL LOW (ref 26.0–34.0)
MCHC: 29.7 g/dL — ABNORMAL LOW (ref 30.0–36.0)
MCV: 81.7 fL (ref 80.0–100.0)
Monocytes Absolute: 0.5 10*3/uL (ref 0.1–1.0)
Monocytes Relative: 7 %
Neutro Abs: 3.7 10*3/uL (ref 1.7–7.7)
Neutrophils Relative %: 53 %
Platelets: 500 10*3/uL — ABNORMAL HIGH (ref 150–400)
RBC: 4.37 MIL/uL (ref 3.87–5.11)
RDW: 14.7 % (ref 11.5–15.5)
WBC: 7 10*3/uL (ref 4.0–10.5)
nRBC: 0 % (ref 0.0–0.2)

## 2019-04-05 LAB — COMPREHENSIVE METABOLIC PANEL
ALT: 12 U/L (ref 0–44)
AST: 16 U/L (ref 15–41)
Albumin: 3.6 g/dL (ref 3.5–5.0)
Alkaline Phosphatase: 88 U/L (ref 38–126)
Anion gap: 6 (ref 5–15)
BUN: 11 mg/dL (ref 6–20)
CO2: 26 mmol/L (ref 22–32)
Calcium: 8.6 mg/dL — ABNORMAL LOW (ref 8.9–10.3)
Chloride: 108 mmol/L (ref 98–111)
Creatinine, Ser: 0.85 mg/dL (ref 0.44–1.00)
GFR calc Af Amer: >60 mL/min (ref >60)
GFR calc non Af Amer: >60 mL/min (ref >60)
Glucose, Bld: 90 mg/dL (ref 70–99)
Potassium: 3.3 mmol/L — ABNORMAL LOW (ref 3.5–5.1)
Sodium: 140 mmol/L (ref 135–145)
Total Bilirubin: 0.7 mg/dL (ref 0.3–1.2)
Total Protein: 7.3 g/dL (ref 6.5–8.1)

## 2019-04-05 LAB — URINALYSIS, COMPLETE (UACMP) WITH MICROSCOPIC
Bilirubin Urine: NEGATIVE
Glucose, UA: NEGATIVE mg/dL
Hgb urine dipstick: NEGATIVE
Ketones, ur: NEGATIVE mg/dL
Nitrite: NEGATIVE
Protein, ur: 30 mg/dL — AB
Specific Gravity, Urine: 1.028 (ref 1.005–1.030)
pH: 5 (ref 5.0–8.0)

## 2019-04-05 LAB — LIPASE, BLOOD: Lipase: 39 U/L (ref 11–51)

## 2019-04-05 LAB — POCT PREGNANCY, URINE: Preg Test, Ur: NEGATIVE
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
## 2019-04-05 MED ORDER — ACETAMINOPHEN 325 MG PO TABS
650.0000 mg | ORAL_TABLET | Freq: Once | ORAL | Status: AC
  Administered 2019-04-05: 650 mg via ORAL
  Filled 2019-04-05: qty 2

## 2019-04-05 MED ORDER — IOHEXOL 300 MG/ML  SOLN
100.0000 mL | Freq: Once | INTRAMUSCULAR | Status: AC | PRN
  Administered 2019-04-05: 100 mL via INTRAVENOUS
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
## 2019-04-05 NOTE — ED Notes (Signed)
Verbal orders from Vladimir Crofts, NP regarding patient's care.
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
## 2019-04-05 NOTE — Discharge Instructions (Addendum)
CT scan was noted below.  Follow this up with your primary care doctor.  You may need a colonoscopy outpatient.  Continue taking Tylenol and ibuprofen for pain.  IMPRESSION:  1. No evidence of acute traumatic injury to the abdomen or pelvis.  2. Nondistended distal descending and sigmoid colon versus wall  thickening. While likely incidental, there multiple prominent  pericolonic nodes measuring up to 6 mm. Recommend correlation for  pre traumatic GI symptoms. Length of involvement would be unusual  for malignancy, underlying colitis is considered.
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
## 2019-12-02 ENCOUNTER — Other Ambulatory Visit: Payer: Self-pay

## 2019-12-02 ENCOUNTER — Encounter (HOSPITAL_COMMUNITY): Payer: Self-pay | Admitting: Emergency Medicine

## 2019-12-02 ENCOUNTER — Inpatient Hospital Stay (HOSPITAL_COMMUNITY)
Admission: EM | Admit: 2019-12-02 | Discharge: 2019-12-02 | Disposition: A | Discharge status: Home / Self Care | Payer: Medicaid Other | Attending: Obstetrics & Gynecology | Admitting: Obstetrics & Gynecology

## 2019-12-02 ENCOUNTER — Inpatient Hospital Stay (HOSPITAL_COMMUNITY): Payer: Medicaid Other

## 2019-12-02 DIAGNOSIS — Z87891 Personal history of nicotine dependence: Secondary | ICD-10-CM | POA: Insufficient documentation

## 2019-12-02 DIAGNOSIS — R1012 Left upper quadrant pain: Secondary | ICD-10-CM | POA: Diagnosis not present

## 2019-12-02 DIAGNOSIS — O26891 Other specified pregnancy related conditions, first trimester: Secondary | ICD-10-CM

## 2019-12-02 DIAGNOSIS — O4691 Antepartum hemorrhage, unspecified, first trimester: Secondary | ICD-10-CM | POA: Diagnosis not present

## 2019-12-02 DIAGNOSIS — Z3A01 Less than 8 weeks gestation of pregnancy: Secondary | ICD-10-CM

## 2019-12-02 DIAGNOSIS — R109 Unspecified abdominal pain: Secondary | ICD-10-CM

## 2019-12-02 DIAGNOSIS — O209 Hemorrhage in early pregnancy, unspecified: Secondary | ICD-10-CM | POA: Diagnosis not present

## 2019-12-02 DIAGNOSIS — O3680X0 Pregnancy with inconclusive fetal viability, not applicable or unspecified: Secondary | ICD-10-CM | POA: Diagnosis not present

## 2019-12-02 LAB — CBC
HCT: 37.5 % (ref 36.0–46.0)
Hemoglobin: 11.2 g/dL — ABNORMAL LOW (ref 12.0–15.0)
MCH: 25.8 pg — ABNORMAL LOW (ref 26.0–34.0)
MCHC: 29.9 g/dL — ABNORMAL LOW (ref 30.0–36.0)
MCV: 86.4 fL (ref 80.0–100.0)
Platelets: 555 10*3/uL — ABNORMAL HIGH (ref 150–400)
RBC: 4.34 MIL/uL (ref 3.87–5.11)
RDW: 14.8 % (ref 11.5–15.5)
WBC: 5.6 10*3/uL (ref 4.0–10.5)
nRBC: 0 % (ref 0.0–0.2)

## 2019-12-02 LAB — URINALYSIS, ROUTINE W REFLEX MICROSCOPIC
Bilirubin Urine: NEGATIVE
Glucose, UA: NEGATIVE mg/dL
Hgb urine dipstick: NEGATIVE
Ketones, ur: NEGATIVE mg/dL
Leukocytes,Ua: NEGATIVE
Nitrite: POSITIVE — AB
Protein, ur: NEGATIVE mg/dL
Specific Gravity, Urine: 1.019 (ref 1.005–1.030)
pH: 6 (ref 5.0–8.0)

## 2019-12-02 LAB — COMPREHENSIVE METABOLIC PANEL
ALT: 16 U/L (ref 0–44)
AST: 18 U/L (ref 15–41)
Albumin: 3.2 g/dL — ABNORMAL LOW (ref 3.5–5.0)
Alkaline Phosphatase: 78 U/L (ref 38–126)
Anion gap: 8 (ref 5–15)
BUN: 5 mg/dL — ABNORMAL LOW (ref 6–20)
CO2: 24 mmol/L (ref 22–32)
Calcium: 8.7 mg/dL — ABNORMAL LOW (ref 8.9–10.3)
Chloride: 103 mmol/L (ref 98–111)
Creatinine, Ser: 0.89 mg/dL (ref 0.44–1.00)
GFR calc Af Amer: >60 mL/min (ref >60)
GFR calc non Af Amer: >60 mL/min (ref >60)
Glucose, Bld: 119 mg/dL — ABNORMAL HIGH (ref 70–99)
Potassium: 3.4 mmol/L — ABNORMAL LOW (ref 3.5–5.1)
Sodium: 135 mmol/L (ref 135–145)
Total Bilirubin: 0.5 mg/dL (ref 0.3–1.2)
Total Protein: 6.9 g/dL (ref 6.5–8.1)

## 2019-12-02 LAB — I-STAT BETA HCG BLOOD, ED (MC, WL, AP ONLY): I-stat hCG, quantitative: 850.2 m[IU]/mL — ABNORMAL HIGH (ref <5)

## 2019-12-02 LAB — WET PREP, GENITAL
Sperm: NONE SEEN
Trich, Wet Prep: NONE SEEN
Yeast Wet Prep HPF POC: NONE SEEN

## 2019-12-02 LAB — ABO/RH: ABO/RH(D): O POS

## 2019-12-02 LAB — HCG, QUANTITATIVE, PREGNANCY: hCG, Beta Chain, Quant, S: 1160 m[IU]/mL — ABNORMAL HIGH (ref <5)

## 2019-12-02 LAB — LIPASE, BLOOD: Lipase: 30 U/L (ref 11–51)

## 2019-12-02 LAB — POC OCCULT BLOOD, ED: Fecal Occult Bld: NEGATIVE

## 2019-12-02 MED ORDER — FAMOTIDINE 20 MG PO TABS
20.0000 mg | ORAL_TABLET | Freq: Once | ORAL | Status: AC
  Administered 2019-12-02: 20 mg via ORAL
  Filled 2019-12-02: qty 1

## 2019-12-02 MED ORDER — SODIUM CHLORIDE 0.9% FLUSH: 3.0000 mL | Freq: Once | INTRAVENOUS | Status: DC

## 2019-12-02 MED ORDER — CONCEPT OB 130-92.4-1 MG PO CAPS: 1.0000 | ORAL_CAPSULE | Freq: Every day | ORAL | 12 refills | Status: DC

## 2019-12-02 MED ORDER — TRAMADOL HCL 50 MG PO TABS: 50.0000 mg | ORAL_TABLET | Freq: Four times a day (QID) | ORAL | 0 refills | Status: DC | PRN

## 2019-12-02 MED ORDER — CONCEPT OB 130-92.4-1 MG PO CAPS: 1.0000 | ORAL_CAPSULE | Freq: Every day | ORAL | 11 refills | Status: DC

## 2019-12-02 MED ORDER — TRAMADOL HCL 50 MG PO TABS
100.0000 mg | ORAL_TABLET | Freq: Once | ORAL | Status: AC
  Administered 2019-12-02: 100 mg via ORAL
  Filled 2019-12-02: qty 2

## 2019-12-02 MED ORDER — METRONIDAZOLE 500 MG PO TABS: 500.0000 mg | ORAL_TABLET | Freq: Two times a day (BID) | ORAL | 0 refills | Status: DC

## 2019-12-02 MED ORDER — ACETAMINOPHEN 325 MG PO TABS
650.0000 mg | ORAL_TABLET | Freq: Once | ORAL | Status: AC
  Administered 2019-12-02: 650 mg via ORAL
  Filled 2019-12-02: qty 2

## 2019-12-02 NOTE — Discharge Instructions (Signed)
Vaginal Bleeding During Pregnancy, First Trimester  A small amount of bleeding from the vagina (spotting) is relatively common during early pregnancy. It usually stops on its own. Various things may cause bleeding or spotting during early pregnancy. Some bleeding may be related to the pregnancy, and some may not. In many cases, the bleeding is normal and is not a problem. However, bleeding can also be a sign of something serious. Be sure to tell your health care provider about any vaginal bleeding right away. Some possible causes of vaginal bleeding during the first trimester include:  Infection or inflammation of the cervix.  Growths (polyps) on the cervix.  Miscarriage or threatened miscarriage.  Pregnancy tissue developing outside of the uterus (ectopic pregnancy).  A mass of tissue developing in the uterus due to an egg being fertilized incorrectly (molar pregnancy). Follow these instructions at home: Activity  Follow instructions from your health care provider about limiting your activity. Ask what activities are safe for you.  If needed, make plans for someone to help with your regular activities.  Do not have sex or orgasms until your health care provider says that this is safe. General instructions  Take over-the-counter and prescription medicines only as told by your health care provider.  Pay attention to any changes in your symptoms.  Do not use tampons or douche.  Write down how many pads you use each day, how often you change pads, and how soaked (saturated) they are.  If you pass any tissue from your vagina, save the tissue so you can show it to your health care provider.  Keep all follow-up visits as told by your health care provider. This is important. Contact a health care provider if:  You have vaginal bleeding during any part of your pregnancy.  You have cramps or labor pains.  You have a fever. Get help right away if:  You have severe cramps in your  back or abdomen.  You pass large clots or a large amount of tissue from your vagina.  Your bleeding increases.  You feel light-headed or weak, or you faint.  You have chills.  You are leaking fluid or have a gush of fluid from your vagina. Summary  A small amount of bleeding (spotting) from the vagina is relatively common during early pregnancy.  Various things may cause bleeding or spotting in early pregnancy.  Be sure to tell your health care provider about any vaginal bleeding right away. This information is not intended to replace advice given to you by your health care provider. Make sure you discuss any questions you have with your health care provider. Document Revised: 12/23/2018 Document Reviewed: 12/06/2016 Elsevier Patient Education  2020 [employer]  

## 2019-12-02 NOTE — ED Triage Notes (Signed)
C/o LUQ "cramp" since yesterday.  Denies nausea, vomiting, diarrhea, and urinary complaint.  Reports spotting x 3 weeks.

## 2019-12-02 NOTE — ED Provider Notes (Signed)
[facility] EMERGENCY DEPARTMENT Provider Note   CSN: [id number] Arrival date & time: 12/02/19  1154     History Chief Complaint  Patient presents with  . Abdominal Pain    Lindsey Hunt is a 39 y.o. female.  HPI   Patient is a 39 year old female with a history of anemia, who presents to the emergency department today complaining of pain to the left upper quadrant. She states that she has had pain there since yesterday. It is constant in nature. States it feels like a cramp. It is worse when she moves. She has no associated nausea, vomiting, diarrhea or constipation. States it is not worse with eating. She has no chest pain, shortness of breath or pleuritic pain. She does note that last week she had some blood in her stool and also had some dark/tarry stools. This is since resolved. She denies any urinary symptoms.  She notes that she has also had vaginal bleeding for about 3 weeks. She normally has heavy menstrual cycles that last for about 10 days. Earlier this month she started bleeding for about 2 weeks. The bleeding improved and now she has been having spotting for the last week. She denies any abnormal vaginal discharge. She has been sexually active with one partner in the last 12 months and states she is not concerned for STDs.  States she drinks alcohol about twice per week. She denies heavy use of NSAIDs.  Past Medical History:  Diagnosis Date  . Anemia     There are no problems to display for this patient.   Past Surgical History:  Procedure Laterality Date  . BUNIONECTOMY Left      OB History   No obstetric history on file.     No family history on file.  Social History   Tobacco Use  . Smoking status: Former Research scientist (life sciences)  . Smokeless tobacco: Never Used  Substance Use Topics  . Alcohol use: Yes  . Drug use: No    Home Medications Prior to Admission medications   Medication Sig Start Date End Date Taking? Authorizing Provider    benzonatate (TESSALON) 100 MG capsule Take 1 capsule (100 mg total) by mouth every 8 (eight) hours. 11/18/18   Caccavale, Sophia, PA-C  ferrous sulfate 325 (65 FE) MG tablet Take 325 mg by mouth daily with breakfast.    [provider]  fluticasone (FLONASE) 50 MCG/ACT nasal spray Place 1 spray into both nostrils daily. 11/18/18   Caccavale, Sophia, PA-C  HYDROcodone-acetaminophen (NORCO) 5-325 MG tablet Take 1 tablet by mouth every 4 (four) hours as needed for moderate pain. 10/23/15   Harvest Dark, PA-C  hydrocortisone valerate ointment (WESTCORT) 0.2 % Apply 1 application topically 2 (two) times daily. 05/13/15   Sable Feil, PA-C  hydrOXYzine (ATARAX/VISTARIL) 25 MG tablet Take 1 tablet (25 mg total) by mouth every 6 (six) hours. 03/28/17   Lysbeth Penner, FNP  methylPREDNISolone (MEDROL DOSEPAK) 4 MG TBPK tablet Take 6-5-4-3-2-1- po qd 03/28/17   Lysbeth Penner, FNP  naproxen (NAPROSYN) 500 MG tablet Take 1 tablet (500 mg total) by mouth 2 (two) times daily with a meal. 11/18/18   Caccavale, Sophia, PA-C  OVER THE COUNTER MEDICATION Birth control pills (cannot remember the name)    [provider]  promethazine-dextromethorphan (PROMETHAZINE-DM) 6.25-15 MG/5ML syrup Take 5 mLs by mouth 4 (four) times daily as needed. 11/18/18   Caccavale, Sophia, PA-C    Allergies    Patient has no known  allergies.  Review of Systems   Review of Systems  Constitutional: Negative for chills and fever.  HENT: Negative for ear pain and sore throat.   Eyes: Negative for visual disturbance.  Respiratory: Negative for cough and shortness of breath.   Cardiovascular: Negative for chest pain.  Gastrointestinal: Positive for abdominal pain and blood in stool. Negative for constipation, diarrhea, nausea and vomiting.  Genitourinary: Positive for vaginal bleeding. Negative for dysuria, flank pain, frequency, hematuria, pelvic pain, urgency and vaginal discharge.  Musculoskeletal: Negative for  back pain.  Skin: Negative for rash.  Neurological: Negative for dizziness and light-headedness.  All other systems reviewed and are negative.   Physical Exam Updated Vital Signs BP (!) 145/86 (BP Location: Right Arm)   Pulse 93   Temp 98 F (36.7 C) (Oral)   Resp 18   Ht 5\' 6"  (1.676 m)   Wt 93 kg   LMP 11/12/2019   SpO2 99%   BMI 33.09 kg/m   Physical Exam Vitals and nursing note reviewed.  Constitutional:      General: She is not in acute distress.    Appearance: She is well-developed.  HENT:     Head: Normocephalic and atraumatic.  Eyes:     Conjunctiva/sclera: Conjunctivae normal.  Cardiovascular:     Rate and Rhythm: Normal rate and regular rhythm.     Heart sounds: Normal heart sounds. No murmur.  Pulmonary:     Effort: Pulmonary effort is normal. No respiratory distress.     Breath sounds: Normal breath sounds. No wheezing, rhonchi or rales.  Abdominal:     General: Bowel sounds are normal.     Palpations: Abdomen is soft.     Tenderness: There is abdominal tenderness in the epigastric area and left upper quadrant. There is no guarding or rebound.  Genitourinary:    Comments: Pt declined Musculoskeletal:     Cervical back: Neck supple.  Skin:    General: Skin is warm and dry.  Neurological:     Mental Status: She is alert.     ED Results / Procedures / Treatments   Labs (all labs ordered are listed, but only abnormal results are displayed) Labs Reviewed  COMPREHENSIVE METABOLIC PANEL - Abnormal; Notable for the following components:      Result Value   Potassium 3.4 (*)    Glucose, Bld 119 (*)    BUN 5 (*)    Calcium 8.7 (*)    Albumin 3.2 (*)    All other components within normal limits  CBC - Abnormal; Notable for the following components:   Hemoglobin 11.2 (*)    MCH 25.8 (*)    MCHC 29.9 (*)    Platelets 555 (*)    All other components within normal limits  I-STAT BETA HCG BLOOD, ED ([facility], [facility], [facility] ONLY) - Abnormal; Notable for the following  components:   I-stat hCG, quantitative 850.2 (*)    All other components within normal limits  LIPASE, BLOOD  URINALYSIS, ROUTINE W REFLEX MICROSCOPIC  POC OCCULT BLOOD, ED  ABO/RH    EKG None  Radiology No results found.  Procedures Procedures (including critical care time)  Medications Ordered in ED Medications  sodium chloride flush (NS) 0.9 % injection 3 mL (3 mLs Intravenous Not Given 12/02/19 1330)  acetaminophen (TYLENOL) tablet 650 mg (650 mg Oral Given 12/02/19 1330)  famotidine (PEPCID) tablet 20 mg (20 mg Oral Given 12/02/19 1330)    ED Course  I have reviewed the triage vital  signs and the nursing notes.  Pertinent labs & imaging results that were available during my care of the patient were reviewed by me and considered in my medical decision making (see chart for details).    MDM Rules/Calculators/A&P                      39 year old female presenting for evaluation of left upper quadrant abdominal pain. She noted some bloody stools last week which have since improved.  Reviewed/interpreted labs CBC without leukocytosis, anemia is present but improved from prior and it is mild.  She does have thrombocytosis.  She has history of same. CMP with mild hypokalemia, normal kidney function and liver function.  No gross electrolyte abnormality Lipase is negative Urinalysis pending at the time of transfer to MAU. Beta-hCG is slightly positive at 850.  Patient with left upper quadrant abdominal pain.  She has mild tenderness on exam.  Reports of blood in stool about a week ago that is since resolved.  Hemoccult is negative today.  She may have gastritis versus PUD.   1:45 PM discussed case with MAU APP, Lattie Haw, who does recommend transferring patient over to the [facility] for ectopic work-up given positive pregnancy test and abdominal pain.  She states she will start patient on PPI at discharge.  I reassessed the patient and discussed the plan for transfer to MAU.  She is in  agreement.  Advised on follow-up and return precautions. She voices understanding the plan and reasons to return.  All questions answered patient stable for transfer.  Final Clinical Impression(s) / ED Diagnoses Final diagnoses:  LUQ abdominal pain    Rx / DC Orders ED Discharge Orders    None       Bishop Dublin 12/02/19 1402    Valarie Merino, MD 12/06/19 1054

## 2019-12-02 NOTE — MAU Note (Signed)
.   Lindsey Hunt is a 39 y.o. at [redacted]w[redacted]d here in MAU reporting: pain in left upper abdomen. Pt states that she does not know when her LMP was , vaginal bleeding since the middle of February. PT was sent over from the ED after positive pregnancy test. LMP: 11/12/19 Onset of complaint: yesterday Pain score: 8 Vitals:   12/02/19 1444 12/02/19 1445  BP:  123/77  Pulse:  68  Resp:  16  Temp:  98.2 F (36.8 C)  SpO2: 100% 100%     FHT: Lab orders placed from triage: UA

## 2019-12-02 NOTE — MAU Provider Note (Signed)
Chief Complaint: Abdominal Pain   First Provider Initiated Contact with Patient 12/02/19 1818     SUBJECTIVE HPI: Lindsey Hunt is a 39 y.o. 123456 at [redacted]w[redacted]d by uncertain LMP who presents to Maternity Admissions from [facility] ED for light to mod vaginal bleeding x 3 weeks, LUQ abd pain, an pos iStat Quant.   Vaginal Bleeding: light Passage of tissue or clots: denies Dizziness: denies  O POS  Pain Location: left abd, generlaized Quality: sharp Severity: 8/10 on pain scale Duration: 2 days Course: worsening Context: early pregnancy. IUP not verified. Normal work-up in ED. Timing: intermittent Modifying factors: No improvement w/ Tylenol Associated signs and symptoms: Neg for fever, chills. Urinary complaints, GO complaints except chronic loose stools or urgency.   Past Medical History:  Diagnosis Date  . Anemia    OB History  Gravida Para Term Preterm AB Living  7 6 6     6   SAB TAB Ectopic Multiple Live Births          6    # Outcome Date GA Lbr Len/2nd Weight Sex Delivery Anes PTL Lv  7 Current           6 Term 2014     Vag-Spont   LIV  5 Term 2012     Vag-Spont   LIV  4 Term 2010     Vag-Spont   LIV  3 Term 2008     Vag-Spont   LIV  2 Term 2005     Vag-Spont   LIV  1 Term 2004     Vag-Spont   LIV   Past Surgical History:  Procedure Laterality Date  . BUNIONECTOMY Left    Social History   Socioeconomic History  . Marital status: Single    Spouse name: Not on file  . Number of children: Not on file  . Years of education: Not on file  . Highest education level: Not on file  Occupational History  . Not on file  Tobacco Use  . Smoking status: Former Research scientist (life sciences)  . Smokeless tobacco: Never Used  Substance and Sexual Activity  . Alcohol use: Not Currently  . Drug use: No  . Sexual activity: Yes  Other Topics Concern  . Not on file  Social History Narrative  . Not on file   Social Determinants of Health   Financial Resource Strain:   . Difficulty of  Paying Living Expenses:   Food Insecurity:   . Worried About Charity fundraiser in the Last Year:   . Arboriculturist in the Last Year:   Transportation Needs:   . Film/video editor (Medical):   Marland Kitchen Lack of Transportation (Non-Medical):   Physical Activity:   . Days of Exercise per Week:   . Minutes of Exercise per Session:   Stress:   . Feeling of Stress :   Social Connections:   . Frequency of Communication with Friends and Family:   . Frequency of Social Gatherings with Friends and Family:   . Attends Religious Services:   . Active Member of Clubs or Organizations:   . Attends Archivist Meetings:   Marland Kitchen Marital Status:   Intimate Partner Violence:   . Fear of Current or Ex-Partner:   . Emotionally Abused:   Marland Kitchen Physically Abused:   . Sexually Abused:    No current facility-administered medications on file prior to encounter.   Current Outpatient Medications on File Prior to Encounter  Medication Sig Dispense Refill  .  benzonatate (TESSALON) 100 MG capsule Take 1 capsule (100 mg total) by mouth every 8 (eight) hours. 21 capsule 0  . ferrous sulfate 325 (65 FE) MG tablet Take 325 mg by mouth daily with breakfast.    . fluticasone (FLONASE) 50 MCG/ACT nasal spray Place 1 spray into both nostrils daily. 16 g 0  . HYDROcodone-acetaminophen (NORCO) 5-325 MG tablet Take 1 tablet by mouth every 4 (four) hours as needed for moderate pain. 10 tablet 0  . hydrocortisone valerate ointment (WESTCORT) 0.2 % Apply 1 application topically 2 (two) times daily. 45 g 0  . hydrOXYzine (ATARAX/VISTARIL) 25 MG tablet Take 1 tablet (25 mg total) by mouth every 6 (six) hours. 12 tablet 0  . methylPREDNISolone (MEDROL DOSEPAK) 4 MG TBPK tablet Take 6-5-4-3-2-1- po qd 21 tablet 0  . naproxen (NAPROSYN) 500 MG tablet Take 1 tablet (500 mg total) by mouth 2 (two) times daily with a meal. 15 tablet 0  . OVER THE COUNTER MEDICATION Birth control pills (cannot remember the name)    .  promethazine-dextromethorphan (PROMETHAZINE-DM) 6.25-15 MG/5ML syrup Take 5 mLs by mouth 4 (four) times daily as needed. 118 mL 0   No Known Allergies  I have reviewed the past Medical Hx, Surgical Hx, Social Hx, Allergies and Medications.   Review of Systems  Constitutional: Negative for appetite change, chills and fever.  Gastrointestinal: Positive for abdominal pain and diarrhea (occssional loose). Negative for abdominal distention, blood in stool, constipation, nausea and vomiting.  Genitourinary: Negative for difficulty urinating, dysuria, flank pain, frequency, hematuria, pelvic pain, vaginal discharge and vaginal pain.  Musculoskeletal: Negative for back pain.  Neurological: Negative for dizziness.    OBJECTIVE Patient Vitals for the past 24 hrs:  BP Temp Temp src Pulse Resp SpO2 Height Weight  12/02/19 1446 123/77 -- -- 69 -- -- -- --  12/02/19 1445 123/77 98.2 F (36.8 C) -- 68 16 100 % -- --  12/02/19 1444 -- -- -- -- -- 100 % -- --  12/02/19 1202 -- -- -- -- -- -- 5\' 6"  (1.676 m) 93 kg  12/02/19 1201 (!) 145/86 98 F (36.7 C) Oral 93 18 99 % -- --   Constitutional: Well-developed, well-nourished female in no acute distress.  Cardiovascular: normal rate Respiratory: normal rate and effort.  GI: Abd soft, non-tender. Pos BS x 4 MS: Extremities nontender, no edema, normal ROM Neurologic: Alert and oriented x 4.  GU: Neg CVAT.  SPECULUM EXAM: NEFG, physiologic discharge, small amount of dark blood noted, cervix clean  BIMANUAL: cervix closed; uterus top-normal size, no adnexal tenderness or masses. No CMT.  LAB RESULTS Results for orders placed or performed during the hospital encounter of 12/02/19 (from the past 24 hour(s))  ABO/Rh     Status: None   Collection Time: 12/02/19 12:13 PM  Result Value Ref Range   ABO/RH(D) O POS    No rh immune globuloin      NOT A RH IMMUNE GLOBULIN CANDIDATE, PT RH POSITIVE Performed at [facility] [address],  [place], [place] [address]   Lipase, blood     Status: None   Collection Time: 12/02/19 12:20 PM  Result Value Ref Range   Lipase 30 11 - 51 U/L  Comprehensive metabolic panel     Status: Abnormal   Collection Time: 12/02/19 12:20 PM  Result Value Ref Range   Sodium 135 135 - 145 mmol/L   Potassium 3.4 (L) 3.5 - 5.1 mmol/L   Chloride 103  98 - 111 mmol/L   CO2 24 22 - 32 mmol/L   Glucose, Bld 119 (H) 70 - 99 mg/dL   BUN 5 (L) 6 - 20 mg/dL   Creatinine, Ser 0.89 0.44 - 1.00 mg/dL   Calcium 8.7 (L) 8.9 - 10.3 mg/dL   Total Protein 6.9 6.5 - 8.1 g/dL   Albumin 3.2 (L) 3.5 - 5.0 g/dL   AST 18 15 - 41 U/L   ALT 16 0 - 44 U/L   Alkaline Phosphatase 78 38 - 126 U/L   Total Bilirubin 0.5 0.3 - 1.2 mg/dL   GFR calc non Af Amer >60 >60 mL/min   GFR calc Af Amer >60 >60 mL/min   Anion gap 8 5 - 15  CBC     Status: Abnormal   Collection Time: 12/02/19 12:20 PM  Result Value Ref Range   WBC 5.6 4.0 - 10.5 K/uL   RBC 4.34 3.87 - 5.11 MIL/uL   Hemoglobin 11.2 (L) 12.0 - 15.0 g/dL   HCT 37.5 36.0 - 46.0 %   MCV 86.4 80.0 - 100.0 fL   MCH 25.8 (L) 26.0 - 34.0 pg   MCHC 29.9 (L) 30.0 - 36.0 g/dL   RDW 14.8 11.5 - 15.5 %   Platelets 555 (H) 150 - 400 K/uL   nRBC 0.0 0.0 - 0.2 %  hCG, quantitative, pregnancy     Status: Abnormal   Collection Time: 12/02/19 12:20 PM  Result Value Ref Range   hCG, Beta Chain, Quant, S 1,160 (H) <5 mIU/mL  I-Stat beta hCG blood, ED     Status: Abnormal   Collection Time: 12/02/19 12:35 PM  Result Value Ref Range   I-stat hCG, quantitative 850.2 (H) <5 mIU/mL   Comment 3          POC occult blood, ED     Status: None   Collection Time: 12/02/19  1:21 PM  Result Value Ref Range   Fecal Occult Bld NEGATIVE NEGATIVE  Urinalysis, Routine w reflex microscopic     Status: Abnormal   Collection Time: 12/02/19  2:50 PM  Result Value Ref Range   Color, Urine YELLOW YELLOW   APPearance HAZY (A) CLEAR   Specific Gravity, Urine 1.019 1.005 - 1.030   pH 6.0 5.0 - 8.0    Glucose, UA NEGATIVE NEGATIVE mg/dL   Hgb urine dipstick NEGATIVE NEGATIVE   Bilirubin Urine NEGATIVE NEGATIVE   Ketones, ur NEGATIVE NEGATIVE mg/dL   Protein, ur NEGATIVE NEGATIVE mg/dL   Nitrite POSITIVE (A) NEGATIVE   Leukocytes,Ua NEGATIVE NEGATIVE   RBC / HPF 0-5 0 - 5 RBC/hpf   WBC, UA 0-5 0 - 5 WBC/hpf   Bacteria, UA FEW (A) NONE SEEN   Squamous Epithelial / LPF 0-5 0 - 5   Mucus PRESENT   Wet prep, genital     Status: Abnormal   Collection Time: 12/02/19  4:15 PM   Specimen: Genital  Result Value Ref Range   Yeast Wet Prep HPF POC NONE SEEN NONE SEEN   Trich, Wet Prep NONE SEEN NONE SEEN   Clue Cells Wet Prep HPF POC PRESENT (A) NONE SEEN   WBC, Wet Prep HPF POC MODERATE (A) NONE SEEN   Sperm NONE SEEN     IMAGING US OB LESS THAN 14 WEEKS WITH OB TRANSVAGINAL  Result Date: 12/02/2019 CLINICAL DATA:  39 year old pregnant female with 3 weeks of bleeding and left lower quadrant pain since yesterday. Quantitative beta HCG 1,160 today. Uncertain LMP.  EXAM: OBSTETRIC <14 WK [place] AND TRANSVAGINAL OB US TECHNIQUE: Both transabdominal and transvaginal ultrasound examinations were performed for complete evaluation of the gestation as well as the maternal uterus, adnexal regions, and pelvic cul-de-sac. Transvaginal technique was performed to assess early pregnancy. COMPARISON:  No prior scans from this gestation. FINDINGS: Intrauterine gestational sac: Single irregular sac-like structure in the uterine cavity. Yolk sac:  Not Visualized. Embryo:  Not Visualized. Cardiac Activity: Not Visualized. MSD: 10.0 mm   5 w   5 d Subchorionic hemorrhage:  None visualized. Maternal uterus/adnexae: Right ovary measures 4.9 x 3.5 x 3.5 cm and contains a corpus luteum. Left ovary measures 3.2 x 2.6 x 2.9 cm and contains a dominant 2.4 x 2.1 x 2.4 cm follicular cyst. No suspicious ovarian or adnexal masses. Anteverted uterus. No uterine fibroids. IMPRESSION: Pregnancy not definitively localized. Single 1.0  cm irregular sac-like structure in the uterine cavity without definitive features of a pregnancy such as a yolk sac or embryo at this time. No abnormal ovarian or adnexal masses. Sonographic differential diagnosis includes intrauterine gestation of uncertain viability or occult ectopic gestation. Recommend close clinical follow-up and serial serum beta HCG monitoring, with repeat obstetric scan in 2-3 weeks, or earlier as warranted by beta HCG levels and clinical assessment. Electronically Signed   By: Ilona Sorrel M.D.   On: 12/02/2019 18:57    MAU COURSE CBC, Quant, ABO/Rh, ultrasound, wet prep and GC/chlamydia culture, UA  MDM Pain and bleeding in early pregnancy with pregnancy of unknown anatomic location, but hemodynamically stable.  UA w/ Nitrites, but no urinary complaints. Will culture urine and Tx PRN.   ASSESSMENT 1. LUQ abdominal pain   2. Vaginal bleeding in pregnancy, first trimester   3. Abdominal pain during pregnancy in first trimester   4. Pregnancy of unknown anatomic location     PLAN Discharge home in stable condition. Ectopic and SAB precautions Follow-up Information    [facility] Follow up on 12/04/2019.   Specialty: Obstetrics and Gynecology Why: for blood work Friday evening  Contact information: [address] [id number] [place] [phone]         Allergies as of 12/02/2019   No Known Allergies     Medication List    STOP taking these medications   benzonatate 100 MG capsule Commonly known as: TESSALON   ferrous sulfate 325 (65 FE) MG tablet   fluticasone 50 MCG/ACT nasal spray Commonly known as: FLONASE   HYDROcodone-acetaminophen 5-325 MG tablet Commonly known as: Norco   hydrocortisone valerate ointment 0.2 % Commonly known as: Westcort   hydrOXYzine 25 MG tablet Commonly known as: ATARAX/VISTARIL   methylPREDNISolone 4 MG Tbpk tablet Commonly known as: MEDROL DOSEPAK    naproxen 500 MG tablet Commonly known as: NAPROSYN   OVER THE COUNTER MEDICATION   promethazine-dextromethorphan 6.25-15 MG/5ML syrup Commonly known as: PROMETHAZINE-DM     TAKE these medications   Concept OB 130-92.4-1 MG Caps Take 1 tablet by mouth daily.   metroNIDAZOLE 500 MG tablet Commonly known as: Flagyl Take 1 tablet (500 mg total) by mouth 2 (two) times daily.   traMADol 50 MG tablet Commonly known as: Ultram Take 1-2 tablets (50-100 mg total) by mouth every 6 (six) hours as needed for severe pain.        Tamala Julian, [place], [place] 12/02/2019  8:03 PM  4

## 2019-12-03 LAB — GC/CHLAMYDIA PROBE AMP (~~LOC~~) NOT AT ARMC
Chlamydia: NEGATIVE
Comment: NEGATIVE
Comment: NORMAL
Neisseria Gonorrhea: NEGATIVE

## 2019-12-04 ENCOUNTER — Other Ambulatory Visit: Payer: Self-pay

## 2019-12-04 ENCOUNTER — Inpatient Hospital Stay (HOSPITAL_COMMUNITY)
Admission: AD | Admit: 2019-12-04 | Discharge: 2019-12-04 | Disposition: A | Discharge status: Home / Self Care | Payer: Medicaid Other | Attending: Obstetrics & Gynecology | Admitting: Obstetrics & Gynecology

## 2019-12-04 DIAGNOSIS — Z3A01 Less than 8 weeks gestation of pregnancy: Secondary | ICD-10-CM | POA: Diagnosis not present

## 2019-12-04 DIAGNOSIS — O26891 Other specified pregnancy related conditions, first trimester: Secondary | ICD-10-CM | POA: Diagnosis not present

## 2019-12-04 DIAGNOSIS — O3680X0 Pregnancy with inconclusive fetal viability, not applicable or unspecified: Secondary | ICD-10-CM

## 2019-12-04 DIAGNOSIS — R1032 Left lower quadrant pain: Secondary | ICD-10-CM | POA: Diagnosis not present

## 2019-12-04 LAB — CULTURE, OB URINE: Special Requests: NORMAL

## 2019-12-04 LAB — HCG, QUANTITATIVE, PREGNANCY: hCG, Beta Chain, Quant, S: 962 m[IU]/mL — ABNORMAL HIGH (ref <5)

## 2019-12-04 NOTE — MAU Note (Signed)
.   Lindsey Hunt is a 39 y.o. at [redacted]w[redacted]d here in MAU reporting: she is here for follow up blood work. Scant amount of vaginal bleeding with lower abdominal cramping 6/10 LMP: 11/12/19 Onset of complaint: ongoing Pain score: 6 Vitals:   12/04/19 1609  BP: 134/80  Pulse: 70  Resp: 18  Temp: 98.7 F (37.1 C)  SpO2: 100%     FHT: Lab orders placed from triage: BHCG

## 2019-12-04 NOTE — MAU Provider Note (Signed)
History   Chief Complaint:  Labs Only   Lindsey Hunt is  39 y.o. [id number] Patient's last menstrual period was 11/12/2019.Marland Kitchen Patient is here for follow up of quantitative HCG and ongoing surveillance of pregnancy status.   She is [redacted]w[redacted]d weeks gestation  by LMP.    Since her last visit, the patient is without new complaint.   The patient reports bleeding as  Scant amount.  Having some lower abdominal cramping - 6/10.  General ROS:  no other problems voiced  Her previous Quantitative HCG values are: 3/`17/21 1160  Client advised to wait in the lobby but when results were available, client was no longer in the lobby.  Called her on her cell phone, verified identity with 2 identifiers, and discussed results and plan of care to return on Sunday afternoon to repeat lab work.  Physical Exam   Blood pressure 134/80, pulse 70, temperature 98.7 F (37.1 C), resp. rate 18, last menstrual period 11/12/2019, SpO2 100 %. . Labs: Results for orders placed or performed during the hospital encounter of 12/04/19 (from the past 24 hour(s))  hCG, quantitative, pregnancy   Collection Time: 12/04/19  4:15 PM  Result Value Ref Range   hCG, Beta Chain, Quant, S 962 (H) <5 mIU/mL    Ultrasound Studies:   US OB LESS THAN 14 WEEKS WITH OB TRANSVAGINAL  Result Date: 12/02/2019 CLINICAL DATA:  39 year old pregnant female with 3 weeks of bleeding and left lower quadrant pain since yesterday. Quantitative beta HCG 1,160 today. Uncertain LMP. EXAM: OBSTETRIC <14 WK [place] AND TRANSVAGINAL OB US TECHNIQUE: Both transabdominal and transvaginal ultrasound examinations were performed for complete evaluation of the gestation as well as the maternal uterus, adnexal regions, and pelvic cul-de-sac. Transvaginal technique was performed to assess early pregnancy. COMPARISON:  No prior scans from this gestation. FINDINGS: Intrauterine gestational sac: Single irregular sac-like structure in the uterine cavity. Yolk sac:  Not  Visualized. Embryo:  Not Visualized. Cardiac Activity: Not Visualized. MSD: 10.0 mm   5 w   5 d Subchorionic hemorrhage:  None visualized. Maternal uterus/adnexae: Right ovary measures 4.9 x 3.5 x 3.5 cm and contains a corpus luteum. Left ovary measures 3.2 x 2.6 x 2.9 cm and contains a dominant 2.4 x 2.1 x 2.4 cm follicular cyst. No suspicious ovarian or adnexal masses. Anteverted uterus. No uterine fibroids. IMPRESSION: Pregnancy not definitively localized. Single 1.0 cm irregular sac-like structure in the uterine cavity without definitive features of a pregnancy such as a yolk sac or embryo at this time. No abnormal ovarian or adnexal masses. Sonographic differential diagnosis includes intrauterine gestation of uncertain viability or occult ectopic gestation. Recommend close clinical follow-up and serial serum beta HCG monitoring, with repeat obstetric scan in 2-3 weeks, or earlier as warranted by beta HCG levels and clinical assessment. Electronically Signed   By: Ilona Sorrel M.D.   On: 12/02/2019 18:57    Assessment Pregnancy of unknown anatomic location Falling quant   Plan: The patient is instructed to follow up in in 2 days.  Advised to return on Sunday late afternoon for repeat BHCG.  Lindsey Hunt L [name] 12/04/2019, 6:00 PM

## 2019-12-04 NOTE — Discharge Instructions (Signed)
Return on Sunday for repeat lab work.

## 2019-12-11 ENCOUNTER — Other Ambulatory Visit: Payer: Self-pay

## 2019-12-11 ENCOUNTER — Encounter (HOSPITAL_COMMUNITY): Payer: Self-pay | Admitting: Obstetrics and Gynecology

## 2019-12-11 ENCOUNTER — Inpatient Hospital Stay (HOSPITAL_COMMUNITY)
Admission: AD | Admit: 2019-12-11 | Discharge: 2019-12-11 | Disposition: A | Discharge status: Home / Self Care | Payer: Medicaid Other | Attending: Obstetrics and Gynecology | Admitting: Obstetrics and Gynecology

## 2019-12-11 ENCOUNTER — Inpatient Hospital Stay (HOSPITAL_COMMUNITY): Payer: Medicaid Other

## 2019-12-11 DIAGNOSIS — Z3A01 Less than 8 weeks gestation of pregnancy: Secondary | ICD-10-CM

## 2019-12-11 DIAGNOSIS — O3680X0 Pregnancy with inconclusive fetal viability, not applicable or unspecified: Secondary | ICD-10-CM

## 2019-12-11 DIAGNOSIS — O99011 Anemia complicating pregnancy, first trimester: Secondary | ICD-10-CM | POA: Insufficient documentation

## 2019-12-11 DIAGNOSIS — O99019 Anemia complicating pregnancy, unspecified trimester: Secondary | ICD-10-CM | POA: Diagnosis not present

## 2019-12-11 DIAGNOSIS — R1031 Right lower quadrant pain: Secondary | ICD-10-CM | POA: Insufficient documentation

## 2019-12-11 DIAGNOSIS — Z6790 Unspecified blood type, Rh positive: Secondary | ICD-10-CM

## 2019-12-11 DIAGNOSIS — D649 Anemia, unspecified: Secondary | ICD-10-CM | POA: Insufficient documentation

## 2019-12-11 LAB — URINALYSIS, ROUTINE W REFLEX MICROSCOPIC
Bilirubin Urine: NEGATIVE
Glucose, UA: NEGATIVE mg/dL
Hgb urine dipstick: NEGATIVE
Ketones, ur: NEGATIVE mg/dL
Leukocytes,Ua: NEGATIVE
Nitrite: NEGATIVE
Protein, ur: NEGATIVE mg/dL
Specific Gravity, Urine: 1.02 (ref 1.005–1.030)
pH: 6 (ref 5.0–8.0)

## 2019-12-11 LAB — CBC
HCT: 35.2 % — ABNORMAL LOW (ref 36.0–46.0)
Hemoglobin: 10.8 g/dL — ABNORMAL LOW (ref 12.0–15.0)
MCH: 25.6 pg — ABNORMAL LOW (ref 26.0–34.0)
MCHC: 30.7 g/dL (ref 30.0–36.0)
MCV: 83.4 fL (ref 80.0–100.0)
Platelets: 500 10*3/uL — ABNORMAL HIGH (ref 150–400)
RBC: 4.22 MIL/uL (ref 3.87–5.11)
RDW: 14.9 % (ref 11.5–15.5)
WBC: 6.7 10*3/uL (ref 4.0–10.5)
nRBC: 0 % (ref 0.0–0.2)

## 2019-12-11 LAB — HCG, QUANTITATIVE, PREGNANCY: hCG, Beta Chain, Quant, S: 596 m[IU]/mL — ABNORMAL HIGH (ref <5)

## 2019-12-11 NOTE — MAU Provider Note (Signed)
[name]  is a 39 y.o. [id number] at [redacted]w[redacted]d who presents to MAU today for RLQ pain. Pain started last week. Describe as sharp and intermittent. Rates 7/10. Has not taken anything for ir. She was seen in MAU 3/19 for pain and had quant hCG of 962 and US showed 1cm irregular sac-like structure, no YS, FP, or adnexal mass. She did not come for 48 hr follow up. She reports worsening RLQ pain today and continues to have spotting.   OB History  Gravida Para Term Preterm AB Living  7 6 6     6   SAB TAB Ectopic Multiple Live Births          6    # Outcome Date GA Lbr Len/2nd Weight Sex Delivery Anes PTL Lv  7 Current           6 Term 2014     Vag-Spont   LIV  5 Term 2012     Vag-Spont   LIV  4 Term 2010     Vag-Spont   LIV  3 Term 2008     Vag-Spont   LIV  2 Term 2005     Vag-Spont   LIV  1 Term 2004     Vag-Spont   LIV    Past Medical History:  Diagnosis Date  . Anemia    ROS: + VB + pain No fever No N/V/D No urinary sx  BP 120/74 (BP Location: Right Arm)   Pulse 70   Temp 98.4 F (36.9 C) (Oral)   Resp 18   Ht 5\' 6"  (1.676 m)   Wt 94.8 kg   LMP 11/12/2019   SpO2 100%   BMI 33.73 kg/m   CONSTITUTIONAL: Well-developed, well-nourished female in no acute distress.  MUSCULOSKELETAL: Normal range of motion.  CARDIOVASCULAR: Regular heart rate RESPIRATORY: Normal effort ABD: soft, non-tender, non-distended, no rebound, no guarding NEUROLOGICAL: Alert and oriented to person, place, and time.  SKIN: Not diaphoretic. No erythema. No pallor. PSYCH: Normal mood and affect. Normal behavior. Normal judgment and thought content.  Results for orders placed or performed during the hospital encounter of 12/11/19 (from the past 24 hour(s))  CBC     Status: Abnormal   Collection Time: 12/11/19  6:07 PM  Result Value Ref Range   WBC 6.7 4.0 - 10.5 K/uL   RBC 4.22 3.87 - 5.11 MIL/uL   Hemoglobin 10.8 (L) 12.0 - 15.0 g/dL   HCT 35.2 (L) 36.0 - 46.0 %   MCV 83.4 80.0 - 100.0 fL    MCH 25.6 (L) 26.0 - 34.0 pg   MCHC 30.7 30.0 - 36.0 g/dL   RDW 14.9 11.5 - 15.5 %   Platelets 500 (H) 150 - 400 K/uL   nRBC 0.0 0.0 - 0.2 %  hCG, quantitative, pregnancy     Status: Abnormal   Collection Time: 12/11/19  6:07 PM  Result Value Ref Range   hCG, Beta Chain, Quant, S 596 (H) <5 mIU/mL  Urinalysis, Routine w reflex microscopic     Status: None   Collection Time: 12/11/19  6:08 PM  Result Value Ref Range   Color, Urine YELLOW YELLOW   APPearance CLEAR CLEAR   Specific Gravity, Urine 1.020 1.005 - 1.030   pH 6.0 5.0 - 8.0   Glucose, UA NEGATIVE NEGATIVE mg/dL   Hgb urine dipstick NEGATIVE NEGATIVE   Bilirubin Urine NEGATIVE NEGATIVE   Ketones, ur NEGATIVE NEGATIVE mg/dL   Protein, ur NEGATIVE NEGATIVE mg/dL  Nitrite NEGATIVE NEGATIVE   Leukocytes,Ua NEGATIVE NEGATIVE   US OB Transvaginal  Result Date: 12/11/2019 CLINICAL DATA:  Pregnancy of unknown location EXAM: OBSTETRIC <14 WK [place] AND TRANSVAGINAL OB US TECHNIQUE: Both transabdominal and transvaginal ultrasound examinations were performed for complete evaluation of the gestation as well as the maternal uterus, adnexal regions, and pelvic cul-de-sac. Transvaginal technique was performed to assess early pregnancy. COMPARISON:  None. FINDINGS: Intrauterine gestational sac: Single Yolk sac:  Not Visualized. Embryo:  Not Visualized. MSD: 6.8 mm   5 w   2 d Subchorionic hemorrhage: Question of a tiny subchorionic hemorrhage. Maternal uterus/adnexae: The ovaries are not well visualized. IMPRESSION: Tiny probable intrauterine gestational sac measuring 5 weeks 2 days, smaller than on the prior exam, with no yolk sac. Findings are suspicious but not yet definitive for failed pregnancy. Recommend follow-up US in 10-14 days for definitive diagnosis with correlation with beta HCG. This recommendation follows SRU consensus guidelines: Diagnostic Criteria for Nonviable Pregnancy Early in the First Trimester. [place] Med [id number].  Electronically Signed   By: Prudencio Pair M.D.   On: 12/11/2019 19:59   MDM: Labs and [place] ordered and reviewed. Significant drop in qhcg and IUGS now smaller than last [place], indicates likely failed pregnancy but since no YS seen then this is pregnancy of unknown location. Discussed with Dr. Glo Herring. Plan for f/u qhcg in 2 days. Discussed findings with pt. Stable for discharge home.   A: 1. Pregnancy, location unknown   2. Blood type, Rh positive   3. Anemia during pregnancy    P: Discharge home Follow up in Bleeding/ectopic precautions Follow up at [place] on 12/14/19 @0900  Tylenol prn Patient may return to MAU as needed or if her condition were to change or worsen   Allergies as of 12/11/2019   No Known Allergies     Medication List    STOP taking these medications   metroNIDAZOLE 500 MG tablet Commonly known as: Flagyl   traMADol 50 MG tablet Commonly known as: Ultram     TAKE these medications   Concept OB 130-92.4-1 MG Caps Take 1 tablet by mouth daily.       Lindsey Hunt, CNM 12/11/2019 8:25 PM

## 2019-12-11 NOTE — MAU Note (Signed)
Presents for repeat HCG level, but c/o pain in RLQ.  Reports has had pain, but has worsened today.  Reports spotting for approx 2 weeks

## 2019-12-11 NOTE — Discharge Instructions (Signed)
Iron Deficiency Anemia, Adult Iron-deficiency anemia is when you have a low amount of red blood cells or hemoglobin. This happens because you have too little iron in your body. Hemoglobin carries oxygen to parts of the body. Anemia can cause your body to not get enough oxygen. It may or may not cause symptoms. Follow these instructions at home: Medicines Take over-the-counter and prescription medicines only as told by your doctor. This includes iron pills (supplements) and vitamins. If you cannot handle taking iron pills by mouth, ask your doctor about getting iron through: A vein (intravenously). A shot (injection) into a muscle. Take iron pills when your stomach is empty. If you cannot handle this, take them with food. Do not drink milk or take antacids at the same time as your iron pills. To prevent trouble pooping (constipation), eat fiber or take medicine (stool softener) as told by your doctor. Eating and drinking  Talk with your doctor before changing the foods you eat. He or she may tell you to eat foods that have a lot of iron, such as: Liver. Lowfat (lean) beef. Breads and cereals that have iron added to them (fortified breads and cereals). Eggs. Dried fruit. Dark green, leafy vegetables. Drink enough fluid to keep your pee (urine) clear or pale yellow. Eat fresh fruits and vegetables that are high in vitamin C. They help your body to use iron. Foods with a lot of vitamin C include: Oranges. Peppers. Tomatoes. Mangoes. General instructions Return to your normal activities as told by your doctor. Ask your doctor what activities are safe for you. Keep yourself clean, and keep things clean around you (your surroundings). Anemia can make you get sick more easily. Keep all follow-up visits as told by your doctor. This is important. Contact a doctor if: You feel sick to your stomach (nauseous). You throw up (vomit). You feel weak. You are sweating for no clear reason. You have  trouble pooping, such as: Pooping (having a bowel movement) less than 3 times a week. Straining to poop. Having poop that is hard, dry, or larger than normal. Feeling full or bloated. Pain in the lower belly. Not feeling better after pooping. Get help right away if: You pass out (faint). If this happens, do not drive yourself to the hospital. Call your local emergency services (911 in the [place] You have chest pain. You have shortness of breath that: Is very bad. Gets worse with physical activity. You have a fast heartbeat. You get light-headed when getting up from sitting or lying down. This information is not intended to replace advice given to you by your health care provider. Make sure you discuss any questions you have with your health care provider. Document Revised: 08/16/2017 Document Reviewed: 05/23/2016 Elsevier Patient Education  [employer].   Abdominal Pain During Pregnancy  Belly (abdominal) pain is common during pregnancy. There are many possible causes. Most of the time, it is not a serious problem. Other times, it can be a sign that something is wrong with the pregnancy. Always tell your doctor if you have belly pain. Follow these instructions at home:  Do not have sex or put anything in your vagina until your pain goes away completely.  Get plenty of rest until your pain gets better.  Drink enough fluid to keep your pee (urine) pale yellow.  Take over-the-counter and prescription medicines only as told by your doctor.  Keep all follow-up visits as told by your doctor. This is important. Contact a doctor if:  Your pain continues or gets worse after resting.  You have lower belly pain that: ? Comes and goes at regular times. ? Spreads to your back. ? Feels like menstrual cramps.  You have pain or burning when you pee (urinate). Get help right away if:  You have a fever or chills.  You have vaginal bleeding.  You are leaking fluid from your  vagina.  You are passing tissue from your vagina.  You throw up (vomit) for more than 24 hours.  You have watery poop (diarrhea) for more than 24 hours.  Your baby is moving less than usual.  You feel very weak or faint.  You have shortness of breath.  You have very bad pain in your upper belly. Summary  Belly (abdominal) pain is common during pregnancy. There are many possible causes.  If you have belly pain during pregnancy, tell your doctor right away.  Keep all follow-up visits as told by your doctor. This is important. This information is not intended to replace advice given to you by your health care provider. Make sure you discuss any questions you have with your health care provider. Document Revised: 12/22/2018 Document Reviewed: 12/06/2016 Elsevier Patient Education  [employer].

## 2019-12-14 ENCOUNTER — Encounter: Payer: Self-pay | Admitting: Family Medicine

## 2019-12-14 ENCOUNTER — Ambulatory Visit: Payer: Medicaid Other

## 2019-12-14 ENCOUNTER — Other Ambulatory Visit: Payer: Self-pay

## 2019-12-14 ENCOUNTER — Other Ambulatory Visit: Payer: Self-pay | Admitting: Family Medicine

## 2019-12-14 ENCOUNTER — Ambulatory Visit (INDEPENDENT_AMBULATORY_CARE_PROVIDER_SITE_OTHER): Payer: Medicaid Other | Admitting: General Practice

## 2019-12-14 DIAGNOSIS — O200 Threatened abortion: Secondary | ICD-10-CM

## 2019-12-14 DIAGNOSIS — O3680X0 Pregnancy with inconclusive fetal viability, not applicable or unspecified: Secondary | ICD-10-CM

## 2019-12-14 LAB — BETA HCG QUANT (REF LAB): hCG Quant: 397 m[IU]/mL

## 2019-12-14 NOTE — Progress Notes (Signed)
Chart reviewed - agree with CMA/RN documentation.  ° °

## 2019-12-14 NOTE — Progress Notes (Signed)
Patient presents to office today for stat bhcg following MAU visit on 3/19 & 3/26. Patient reports continued RLQ pain similar to menstrual cramps, rates pain at 7 this morning & is spotting. Discussed with patient we are monitoring your bhcg levels today, results take approximately 2 hours and will be reviewed with a provider. We will then call you with results. Patient verbalized understanding & provided callback number [phone].   Reviewed with Dr Nehemiah Settle who finds inappropriate decrease in bhcg levels. Patient should return to MAU for MTX.   Called patient several times, no answer & message states voicemail box is full.  Called patient and informed her of results and recommendation for MTX. Patient verbalized understanding and asked several questions regarding MTX treatment, process, ability to work, etc. Call transferred to Dr Nehemiah Settle to speak with patient.   Koren Bound RN BSN 12/14/19

## 2019-12-14 NOTE — Progress Notes (Signed)
Pregnancy of unknown location - quants dropped from 1100 to 400 over 10 days. No IUP on [place]. Will give methotrexate for nonviable pregnancy of unknown location. Patient instructed to go to MAU, which she will do either tonight or tomorrow AM. Ectopic precautions given.  Truett Mainland, DO

## 2019-12-15 ENCOUNTER — Inpatient Hospital Stay (HOSPITAL_COMMUNITY)
Admission: AD | Admit: 2019-12-15 | Discharge: 2019-12-15 | Disposition: A | Discharge status: Home / Self Care | Payer: Medicaid Other | Attending: Family Medicine | Admitting: Family Medicine

## 2019-12-15 ENCOUNTER — Other Ambulatory Visit: Payer: Self-pay

## 2019-12-15 DIAGNOSIS — Z3A00 Weeks of gestation of pregnancy not specified: Secondary | ICD-10-CM | POA: Diagnosis not present

## 2019-12-15 DIAGNOSIS — O26891 Other specified pregnancy related conditions, first trimester: Secondary | ICD-10-CM | POA: Insufficient documentation

## 2019-12-15 DIAGNOSIS — R109 Unspecified abdominal pain: Secondary | ICD-10-CM | POA: Insufficient documentation

## 2019-12-15 DIAGNOSIS — O009 Unspecified ectopic pregnancy without intrauterine pregnancy: Secondary | ICD-10-CM | POA: Diagnosis not present

## 2019-12-15 MED ORDER — METHOTREXATE FOR ECTOPIC PREGNANCY
50.0000 mg/m2 | Freq: Once | INTRAMUSCULAR | Status: AC
  Administered 2019-12-15: 105 mg via INTRAMUSCULAR
  Filled 2019-12-15: qty 1

## 2019-12-15 NOTE — Discharge Instructions (Signed)
Methotrexate Treatment for an Ectopic Pregnancy  Methotrexate is a medicine that treats an ectopic pregnancy. An ectopic pregnancy is a pregnancy in which the fetus develops outside the uterus. This kind of pregnancy can be dangerous. Methotrexate works by stopping the growth of the fertilized egg. It also helps your body absorb tissue from the egg. This takes between 2-6 weeks. Most ectopic pregnancies can be successfully treated with methotrexate if they are diagnosed early. Tell a health care provider about:  Any allergies you have.  All medicines you are taking, including vitamins, herbs, eye drops, creams, and over-the-counter medicines.  Any medical conditions you have. What are the risks? Generally, this is a safe treatment. However, problems may occur, including:  Nausea or vomiting or both.  Vaginal bleeding or spotting.  Diarrhea.  Abdominal cramping.  Dizziness or feeling lightheaded.  Mouth sores.  Swelling or irritation of the lining of your lungs (pneumonitis).  Liver damage.  Hair loss. There is a risk that methotrexate treatment will fail and your pregnancy will continue. There is also a risk that the ectopic pregnancy might rupture while you are using this medicine. What happens before the procedure?  Liver tests, kidney tests, and a complete blood test will be done.  Blood tests will be done to measure the pregnancy hormone levels and to determine your blood type.  If you are Rh-negative and the father is Rh-positive or his Rh type is not known, you will be given a Rho (D) immune globulin shot. What happens during the procedure? Your health care provider may give you methotrexate by injection or in the form of a pill. Methotrexate may be given as a single dose of medicine or a series of doses, depending on your response to the treatment.  Methotrexate injections will be given by your health care provider. This is the most common way that methotrexate is used  to treat an ectopic pregnancy.  If you are prescribed oral methotrexate, it is very important that you follow your health care provider's instructions on how to take oral methotrexate. Additional medicines may be needed to manage an ectopic pregnancy. The procedure may vary among health care providers and hospitals. What happens after the procedure?  You may have abdominal cramping, vaginal bleeding, and fatigue.  Blood tests will be taken at timed intervals for several days or weeks to check your pregnancy hormone levels. The blood tests will be done until the pregnancy hormone can no longer be detected in the blood.  You may need to have a surgical procedure to remove the ectopic pregnancy if methotrexate treatment fails.  Follow instructions from your health care provider on how and when to report any symptoms that may indicate a ruptured ectopic pregnancy. Summary  Methotrexate is a medicine that treats an ectopic pregnancy.  Methotrexate may be given in a single dose or a series of doses over time.  Blood tests will be taken at timed intervals for several days or weeks to check your pregnancy hormone levels. The blood tests will be done until no more pregnancy hormone is detected in the blood.  There is a risk that methotrexate treatment will fail and your pregnancy will continue. There is also a risk that the ectopic pregnancy might rupture while you are using this medicine. This information is not intended to replace advice given to you by your health care provider. Make sure you discuss any questions you have with your health care provider. Document Revised: 08/16/2017 Document Reviewed: 10/23/2016 Elsevier Patient   Education  2020 Elsevier Inc.  

## 2019-12-15 NOTE — MAU Provider Note (Signed)
None     S [name] is a 39 y.o. [phone] early female who presents to MAU today for MTX dosing.   Patient being seen and followed by Dr. Joyice Faster.  However, patient states she does not know why she is receiving an injection. Nurse to provider requesting education and reasoning for visit today.   O BP 133/71 (BP Location: Right Arm)   Pulse 69   Temp 98.6 F (37 C)   Resp 20   Ht 5\' 6"  (1.676 m)   Wt 94.3 kg   LMP 11/12/2019   BMI 33.57 kg/m  Physical Exam  Constitutional: She is oriented to person, place, and time. She appears well-developed and well-nourished.  HENT:  Head: Normocephalic and atraumatic.  Eyes: Conjunctivae are normal.  Cardiovascular: Normal rate, regular rhythm and normal heart sounds.  Respiratory: Effort normal and breath sounds normal.  GI: Bowel sounds are normal.  Musculoskeletal:        General: Normal range of motion.     Cervical back: Normal range of motion.  Neurological: She is alert and oriented to person, place, and time.  Skin: Skin is warm and dry.  Psychiatric: She has a normal mood and affect. Her behavior is normal.    A Pregnancy of unknown anatomical location Decreasing hCGs Medical screening exam complete   P -Provider to triage and patient given extensive education regarding why she is receiving MTX. -Reassured that we are not terminating a pregnancy as hCG is already decreasing.  -Discussed risks and benefits of MTX.   -Expectations regarding bleeding and pain given.  -Reviewed need for follow up x 2 and that first day would be on Friday in MAU.  -Warning signs for worsening condition that would warrant emergency follow-up discussed -Encouraged to call if q/c arise prior to next evaluation. -MTX given. -Discharged to home in stable condition.   Gavin Pound, [place] 12/15/2019 8:24 PM

## 2019-12-15 NOTE — MAU Note (Signed)
PT SAYS WAS TOLD TO COME HERE TO GET AN INJECTION.  HAS PAIN RIGHT LOWER ABD  AND SOMETIMES IN LOWER ABD . STILL SPOTTING  WHEN SHE WIPES.

## 2019-12-18 ENCOUNTER — Other Ambulatory Visit: Payer: Self-pay

## 2019-12-18 ENCOUNTER — Inpatient Hospital Stay (HOSPITAL_COMMUNITY)
Admission: AD | Admit: 2019-12-18 | Discharge: 2019-12-18 | Discharge status: Against Medical Advice | Payer: Medicaid Other | Source: Ambulatory Visit | Attending: Family Medicine | Admitting: Family Medicine

## 2019-12-18 DIAGNOSIS — O009 Unspecified ectopic pregnancy without intrauterine pregnancy: Secondary | ICD-10-CM

## 2019-12-18 DIAGNOSIS — O00109 Unspecified tubal pregnancy without intrauterine pregnancy: Secondary | ICD-10-CM | POA: Diagnosis not present

## 2019-12-18 DIAGNOSIS — Z5321 Procedure and treatment not carried out due to patient leaving prior to being seen by health care provider: Secondary | ICD-10-CM | POA: Insufficient documentation

## 2019-12-18 LAB — HCG, QUANTITATIVE, PREGNANCY: hCG, Beta Chain, Quant, S: 233 m[IU]/mL — ABNORMAL HIGH (ref <5)

## 2019-12-18 NOTE — MAU Note (Signed)
Pt called back to be assessed- no in lobby.  [place] notified.

## 2019-12-18 NOTE — MAU Provider Note (Signed)
Lindsey Hunt  is a 39 y.o. [id number] s/p MTX on 12/14/19 for pregnancy of unknown location. Quant HCG was 397.   OB History  Gravida Para Term Preterm AB Living  7 6 6     6   SAB TAB Ectopic Multiple Live Births          6    # Outcome Date GA Lbr Len/2nd Weight Sex Delivery Anes PTL Lv  7 Current           6 Term 2014     Vag-Spont   LIV  5 Term 2012     Vag-Spont   LIV  4 Term 2010     Vag-Spont   LIV  3 Term 2008     Vag-Spont   LIV  2 Term 2005     Vag-Spont   LIV  1 Term 2004     Vag-Spont   LIV    Past Medical History:  Diagnosis Date  . Anemia     LMP 11/12/2019    Results for orders placed or performed during the hospital encounter of 12/18/19 (from the past 24 hour(s))  hCG, quantitative, pregnancy     Status: Abnormal   Collection Time: 12/18/19  3:05 PM  Result Value Ref Range   hCG, Beta Chain, Quant, S 233 (H) <5 mIU/mL    MDM: Pt left before being seen by provider. Discussed results with Dr. Kennon Rounds. Pt called by phone, discussed results of qhcg and notified this is an expected drop after MTX. Pt questioned "what do I need to be doing, nobody has told me anything". I reviewed with her the reason she was given MTX (preganancy of unknown location and abnormal qhcg's), as this indicates an abnormal pregnancy and could be an ectopic. Pt verbalized understanding. Discussed recommendations for f/u on day 7 and appt was made while on the phone with pt and pt given date, time, location. Discussed strict return precautions.  A: 1. Ectopic pregnancy without intrauterine pregnancy, unspecified location     P: Discharge home Return precautions discussed Patient will return for follow-up quant HCG in [place] on 12/21/19 @1330  Patient may return to MAU as needed or if her condition were to change or worsen   Julianne Handler, [place] 12/18/2019 4:52 PM

## 2019-12-18 NOTE — MAU Note (Signed)
Day 4 post MTX

## 2019-12-21 ENCOUNTER — Ambulatory Visit: Payer: Medicaid Other

## 2019-12-25 ENCOUNTER — Inpatient Hospital Stay (HOSPITAL_COMMUNITY)
Admission: AD | Admit: 2019-12-25 | Discharge: 2019-12-25 | Disposition: A | Discharge status: Home / Self Care | Payer: Medicaid Other | Attending: Obstetrics & Gynecology | Admitting: Obstetrics & Gynecology

## 2019-12-25 ENCOUNTER — Other Ambulatory Visit: Payer: Self-pay

## 2019-12-25 DIAGNOSIS — Z3A00 Weeks of gestation of pregnancy not specified: Secondary | ICD-10-CM | POA: Diagnosis not present

## 2019-12-25 DIAGNOSIS — Z5321 Procedure and treatment not carried out due to patient leaving prior to being seen by health care provider: Secondary | ICD-10-CM | POA: Diagnosis not present

## 2019-12-25 DIAGNOSIS — O009 Unspecified ectopic pregnancy without intrauterine pregnancy: Secondary | ICD-10-CM | POA: Insufficient documentation

## 2019-12-25 LAB — HCG, QUANTITATIVE, PREGNANCY: hCG, Beta Chain, Quant, S: 46 m[IU]/mL — ABNORMAL HIGH (ref <5)

## 2019-12-25 NOTE — MAU Provider Note (Signed)
Patient called from the waiting room x2 to discuss hCG of 46 s/p MTX on 12/15/2019 with hCG of 397 on 12/14/2019. Patient not present at either time and left AMA. Called and spoke to patient by telephone, patient identified by two identifiers. Patient reports left-sided lower abdominal pain and low back pain that she rates as 6/10 and light vaginal bleeding. Patient advised to return to MAU for US imaging to confirm she does not have a ruptured ectopic. Patient reports she is on her way to work and does not know if she will come in and does not understand why she needs to be seen so frequently. Extensive conversation with patient about life-threatening nature of ectopic pregnancies, what an ectopic pregnancy is, how it is treated and need for close monitoring requiring frequent trips to the hospital and/or clinic. Pt verbalizes understanding and questions answered to patient satisfaction. Patient advised again she needs to return to hospital tonight for f/u US on localized pain and advised that death can be a result of a ruptured ectopic pregnancy. Patient still noncommittal towards returning to MAU tonight. Patient advised that she will also need a f/u hCG in one week to follow pregnancy hormone levels to zero. Message sent to [facility] to schedule f/u hCG in one week.  Clarisa Fling, NP  8:42 PM 12/25/2019

## 2019-12-25 NOTE — MAU Note (Signed)
Doing ok.  Still having pain.  Small amts of bleeding off and on. Asked pt what happened on Monday, stated "she didn't make it".

## 2020-01-01 ENCOUNTER — Ambulatory Visit (INDEPENDENT_AMBULATORY_CARE_PROVIDER_SITE_OTHER): Payer: Medicaid Other

## 2020-01-01 ENCOUNTER — Other Ambulatory Visit: Payer: Self-pay

## 2020-01-01 DIAGNOSIS — O009 Unspecified ectopic pregnancy without intrauterine pregnancy: Secondary | ICD-10-CM

## 2020-01-01 LAB — BETA HCG QUANT (REF LAB): hCG Quant: 12 m[IU]/mL

## 2020-01-01 NOTE — Progress Notes (Signed)
Pt here today for STAT Beta Lab.  Pt reports that she is here because she had an ectopic pregnancy and received MTX.  Pt reports pain all over the abdomen and rates it 6/10 on pain scale and some vaginal spotting.  Pt states that she is upset about everything is going and that she was going to go to [facility].  Pt states that she spoke with a friend of hers and she was admitted for an ectopic pregnancy.  I reminded pt that because her beta levels are decreasing the MTX has worked and that her situation may be different from her friend in that her tube did not rupture.  The MTX resolved it before it could cause issues.  Pt asked why is she still having vaginal spotting.  I explained to the her levels are still elevated however are getting close to zero so that could explain the vaginal spotting which she should find that it will taper off because her levels are so low.  Pt informed that I will call with results in approximately two hours.   Pt verbalized understanding.  Received notification from [facility] that pt's results are 73.  Notified Kerry Hough, PA who recommended that pt comes back in one week for non stat beta.  Notified pt her results and the need for f/u beta to get levels to zero.  I explained to the pt that it will take 24-48 hrs to her beta results at that time.  Pt verbalized understanding and that she will be able to come in on 01/08/20 @ 1100.  [facility] office notified.   Mel Almond, RN  01/02/20

## 2020-01-04 NOTE — Progress Notes (Signed)
Chart reviewed for nurse visit. Agree with plan of care.   Luvenia Redden, PA-C 01/04/2020 8:45 AM

## 2020-01-07 ENCOUNTER — Other Ambulatory Visit: Payer: Medicaid Other

## 2020-01-07 ENCOUNTER — Other Ambulatory Visit: Payer: Self-pay | Admitting: General Practice

## 2020-01-07 DIAGNOSIS — O039 Complete or unspecified spontaneous abortion without complication: Secondary | ICD-10-CM

## 2020-04-14 ENCOUNTER — Other Ambulatory Visit: Payer: Self-pay

## 2020-04-14 ENCOUNTER — Emergency Department (HOSPITAL_COMMUNITY): Payer: Medicaid Other

## 2020-04-14 ENCOUNTER — Emergency Department (HOSPITAL_COMMUNITY)
Admission: EM | Admit: 2020-04-14 | Discharge: 2020-04-15 | Disposition: A | Discharge status: Home / Self Care | Payer: Medicaid Other | Attending: Emergency Medicine | Admitting: Emergency Medicine

## 2020-04-14 ENCOUNTER — Encounter (HOSPITAL_COMMUNITY): Payer: Self-pay | Admitting: Emergency Medicine

## 2020-04-14 DIAGNOSIS — R05 Cough: Secondary | ICD-10-CM | POA: Diagnosis not present

## 2020-04-14 DIAGNOSIS — Z5321 Procedure and treatment not carried out due to patient leaving prior to being seen by health care provider: Secondary | ICD-10-CM | POA: Insufficient documentation

## 2020-04-14 DIAGNOSIS — R5383 Other fatigue: Secondary | ICD-10-CM | POA: Diagnosis not present

## 2020-04-14 LAB — CBC WITH DIFFERENTIAL/PLATELET
Abs Immature Granulocytes: 0.02 K/uL (ref 0.00–0.07)
Basophils Absolute: 0 K/uL (ref 0.0–0.1)
Basophils Relative: 0 %
Eosinophils Absolute: 0 K/uL (ref 0.0–0.5)
Eosinophils Relative: 1 %
HCT: 39.9 % (ref 36.0–46.0)
Hemoglobin: 11.7 g/dL — ABNORMAL LOW (ref 12.0–15.0)
Immature Granulocytes: 0 %
Lymphocytes Relative: 41 %
Lymphs Abs: 2.3 K/uL (ref 0.7–4.0)
MCH: 23.6 pg — ABNORMAL LOW (ref 26.0–34.0)
MCHC: 29.3 g/dL — ABNORMAL LOW (ref 30.0–36.0)
MCV: 80.4 fL (ref 80.0–100.0)
Monocytes Absolute: 0.3 K/uL (ref 0.1–1.0)
Monocytes Relative: 6 %
Neutro Abs: 2.9 K/uL (ref 1.7–7.7)
Neutrophils Relative %: 52 %
Platelets: 339 K/uL (ref 150–400)
RBC: 4.96 MIL/uL (ref 3.87–5.11)
RDW: 16.8 % — ABNORMAL HIGH (ref 11.5–15.5)
WBC: 5.6 K/uL (ref 4.0–10.5)
nRBC: 0 % (ref 0.0–0.2)

## 2020-04-14 LAB — I-STAT BETA HCG BLOOD, ED (MC, WL, AP ONLY): I-stat hCG, quantitative: <5 m[IU]/mL (ref <5)

## 2020-04-14 LAB — COMPREHENSIVE METABOLIC PANEL WITH GFR
ALT: 17 U/L (ref 0–44)
AST: 23 U/L (ref 15–41)
Albumin: 3.2 g/dL — ABNORMAL LOW (ref 3.5–5.0)
Alkaline Phosphatase: 63 U/L (ref 38–126)
Anion gap: 12 (ref 5–15)
BUN: <5 mg/dL — ABNORMAL LOW (ref 6–20)
CO2: 20 mmol/L — ABNORMAL LOW (ref 22–32)
Calcium: 8.4 mg/dL — ABNORMAL LOW (ref 8.9–10.3)
Chloride: 102 mmol/L (ref 98–111)
Creatinine, Ser: 1.12 mg/dL — ABNORMAL HIGH (ref 0.44–1.00)
GFR calc Af Amer: >60 mL/min
GFR calc non Af Amer: >60 mL/min
Glucose, Bld: 120 mg/dL — ABNORMAL HIGH (ref 70–99)
Potassium: 3.1 mmol/L — ABNORMAL LOW (ref 3.5–5.1)
Sodium: 134 mmol/L — ABNORMAL LOW (ref 135–145)
Total Bilirubin: 0.8 mg/dL (ref 0.3–1.2)
Total Protein: 7.4 g/dL (ref 6.5–8.1)

## 2020-04-14 LAB — URINALYSIS, ROUTINE W REFLEX MICROSCOPIC
Bilirubin Urine: NEGATIVE
Glucose, UA: NEGATIVE mg/dL
Hgb urine dipstick: NEGATIVE
Ketones, ur: NEGATIVE mg/dL
Nitrite: NEGATIVE
Protein, ur: 100 mg/dL — AB
Specific Gravity, Urine: 1.031 — ABNORMAL HIGH (ref 1.005–1.030)
pH: 5 (ref 5.0–8.0)

## 2020-04-14 MED ORDER — ACETAMINOPHEN 325 MG PO TABS
650.0000 mg | ORAL_TABLET | Freq: Once | ORAL | Status: AC
  Administered 2020-04-14: 650 mg via ORAL
  Filled 2020-04-14: qty 2

## 2020-04-14 NOTE — ED Triage Notes (Signed)
Patient reports productive cough with fatigue onset Sunday this week , denies emesis or headache , no dysuria or pain , febrile at triage .

## 2020-04-15 NOTE — ED Notes (Signed)
Called pt name for VS recheck x3. No response from pt.  

## 2020-05-14 ENCOUNTER — Inpatient Hospital Stay (HOSPITAL_COMMUNITY)
Admission: AD | Admit: 2020-05-14 | Discharge: 2020-05-14 | Disposition: A | Discharge status: Home / Self Care | Payer: Medicaid Other | Attending: Family Medicine | Admitting: Family Medicine

## 2020-05-14 ENCOUNTER — Other Ambulatory Visit: Payer: Self-pay

## 2020-05-14 DIAGNOSIS — R109 Unspecified abdominal pain: Secondary | ICD-10-CM | POA: Diagnosis not present

## 2020-05-14 DIAGNOSIS — N939 Abnormal uterine and vaginal bleeding, unspecified: Secondary | ICD-10-CM | POA: Insufficient documentation

## 2020-05-14 DIAGNOSIS — Z3202 Encounter for pregnancy test, result negative: Secondary | ICD-10-CM | POA: Insufficient documentation

## 2020-05-14 LAB — POCT PREGNANCY, URINE: Preg Test, Ur: NEGATIVE

## 2020-05-14 LAB — HCG, QUANTITATIVE, PREGNANCY: hCG, Beta Chain, Quant, S: <1 m[IU]/mL (ref <5)

## 2020-05-14 NOTE — MAU Provider Note (Signed)
First Provider Initiated Contact with Patient 05/14/20 [phone]      S Ms. LESSIE FUNDERBURKE is a 39 y.o. [phone] female who presents to MAU today with complaint of vaginal bleeding and abdominal cramping. She reports a positive HPT 2 days ago.    O BP 119/79 (BP Location: Right Arm)   Pulse 69   Temp 98.6 F (37 C) (Oral)   Resp 16   Ht 5\' 5"  (1.651 m)   Wt 89.8 kg   LMP  (LMP Unknown)   SpO2 98% Comment: room air  BMI 32.93 kg/m  Physical Exam Vitals and nursing note reviewed.  Constitutional:      General: She is not in acute distress.    Appearance: She is well-developed.  HENT:     Head: Normocephalic.  Eyes:     Pupils: Pupils are equal, round, and reactive to light.  Cardiovascular:     Rate and Rhythm: Normal rate and regular rhythm.     Heart sounds: Normal heart sounds.  Pulmonary:     Effort: Pulmonary effort is normal. No respiratory distress.     Breath sounds: Normal breath sounds.  Abdominal:     General: Bowel sounds are normal. There is no distension.     Palpations: Abdomen is soft.     Tenderness: There is no abdominal tenderness.  Skin:    General: Skin is warm and dry.  Neurological:     Mental Status: She is alert and oriented to person, place, and time.  Psychiatric:        Behavior: Behavior normal.        Thought Content: Thought content normal.        Judgment: Judgment normal.    Results for orders placed or performed during the hospital encounter of 05/14/20 (from the past 24 hour(s))  Pregnancy, urine POC     Status: None   Collection Time: 05/14/20  9:30 AM  Result Value Ref Range   Preg Test, Ur NEGATIVE NEGATIVE  hCG, quantitative, pregnancy     Status: None   Collection Time: 05/14/20 10:02 AM  Result Value Ref Range   hCG, Beta Chain, Quant, S <1 <5 mIU/mL   A Non pregnant female Medical screening exam complete  Discussed negative UPT with patient and offered to draw HCG and call patient with results. Patient agreeable to  plan of care.   Negative HCG. Discussed follow up options with patient. Patient verbalized understanding.   P Discharge from MAU in stable condition Patient given the option of transfer to [facility] for further evaluation or seek care in outpatient facility of choice List of options for follow-up given  Warning signs for worsening condition that would warrant emergency follow-up discussed Patient may return to MAU as needed for pregnancy related complaints  Wende Mott, CNM 05/14/2020 9:39 AM

## 2020-05-14 NOTE — MAU Note (Signed)
Lindsey Hunt is a 39 y.o. here in MAU reporting: abdominal pain for the past couple of days that has progressively been getting worse. Pt reports the increase in pain prompted her to take a UPT 2 days ago, test was positive. Reports she is bleeding, has been getting heavier, changed a pad 2 times today. No clots today.  LMP: unknown  Onset of complaint: 3 days  Pain score: 8/10  Vitals:   05/14/20 0936  BP: 119/79  Pulse: 69  Resp: 16  Temp: 98.6 F (37 C)  SpO2: 98%     Lab orders placed from triage: UPT

## 2020-05-14 NOTE — MAU Note (Signed)
Pt to leave once labs are drawn

## 2021-01-26 ENCOUNTER — Emergency Department (HOSPITAL_COMMUNITY)
Admission: EM | Admit: 2021-01-26 | Discharge: 2021-01-27 | Disposition: A | Discharge status: Home / Self Care | Payer: Medicaid Other | Attending: Emergency Medicine | Admitting: Emergency Medicine

## 2021-01-26 ENCOUNTER — Emergency Department (HOSPITAL_COMMUNITY): Payer: Medicaid Other

## 2021-01-26 ENCOUNTER — Other Ambulatory Visit: Payer: Self-pay

## 2021-01-26 DIAGNOSIS — Z87891 Personal history of nicotine dependence: Secondary | ICD-10-CM | POA: Diagnosis not present

## 2021-01-26 DIAGNOSIS — R079 Chest pain, unspecified: Secondary | ICD-10-CM | POA: Diagnosis not present

## 2021-01-26 DIAGNOSIS — M545 Low back pain, unspecified: Secondary | ICD-10-CM | POA: Insufficient documentation

## 2021-01-26 DIAGNOSIS — M549 Dorsalgia, unspecified: Secondary | ICD-10-CM

## 2021-01-26 DIAGNOSIS — M546 Pain in thoracic spine: Secondary | ICD-10-CM | POA: Insufficient documentation

## 2021-01-26 LAB — CBC WITH DIFFERENTIAL/PLATELET
Abs Immature Granulocytes: 0.01 10*3/uL (ref 0.00–0.07)
Basophils Absolute: 0 10*3/uL (ref 0.0–0.1)
Basophils Relative: 0 %
Eosinophils Absolute: 0.4 10*3/uL (ref 0.0–0.5)
Eosinophils Relative: 8 %
HCT: 39.2 % (ref 36.0–46.0)
Hemoglobin: 12 g/dL (ref 12.0–15.0)
Immature Granulocytes: 0 %
Lymphocytes Relative: 41 %
Lymphs Abs: 2.1 10*3/uL (ref 0.7–4.0)
MCH: 27.9 pg (ref 26.0–34.0)
MCHC: 30.6 g/dL (ref 30.0–36.0)
MCV: 91.2 fL (ref 80.0–100.0)
Monocytes Absolute: 0.4 10*3/uL (ref 0.1–1.0)
Monocytes Relative: 8 %
Neutro Abs: 2.2 10*3/uL (ref 1.7–7.7)
Neutrophils Relative %: 43 %
Platelets: 435 10*3/uL — ABNORMAL HIGH (ref 150–400)
RBC: 4.3 MIL/uL (ref 3.87–5.11)
RDW: 14.9 % (ref 11.5–15.5)
WBC: 5.1 10*3/uL (ref 4.0–10.5)
nRBC: 0 % (ref 0.0–0.2)

## 2021-01-26 LAB — BASIC METABOLIC PANEL
Anion gap: 8 (ref 5–15)
BUN: 5 mg/dL — ABNORMAL LOW (ref 6–20)
CO2: 24 mmol/L (ref 22–32)
Calcium: 8.9 mg/dL (ref 8.9–10.3)
Chloride: 107 mmol/L (ref 98–111)
Creatinine, Ser: 0.8 mg/dL (ref 0.44–1.00)
GFR, Estimated: >60 mL/min (ref >60)
Glucose, Bld: 107 mg/dL — ABNORMAL HIGH (ref 70–99)
Potassium: 3.2 mmol/L — ABNORMAL LOW (ref 3.5–5.1)
Sodium: 139 mmol/L (ref 135–145)

## 2021-01-26 LAB — I-STAT BETA HCG BLOOD, ED (MC, WL, AP ONLY): I-stat hCG, quantitative: <5 m[IU]/mL (ref <5)

## 2021-01-26 LAB — TROPONIN I (HIGH SENSITIVITY)
Troponin I (High Sensitivity): <2 ng/L (ref <18)
Troponin I (High Sensitivity): <2 ng/L (ref <18)

## 2021-01-26 NOTE — ED Notes (Signed)
No answer for vitals X2

## 2021-01-26 NOTE — ED Triage Notes (Signed)
Pt c/o back pain that started 3 days ago, states pain moves around in her back. Pt denies numbness, tingling, injury, bowel or bladder incontinence. Currently pain is in left mid back/left flank. Touch makes pain move and increases pain. Pt seen at UC was told to come to ED for evaluation. Pain worsens with walking and movement. Pt c/o left sided chest pain, has shortness of breath, pain with breathing. Pt denies leg pain or coughing.

## 2021-01-26 NOTE — ED Provider Notes (Signed)
Emergency Medicine Provider Triage Evaluation Note  Lindsey Hunt , a 40 y.o. female  was evaluated in triage.  Pt complains of chest pain and back pain that started 3 days ago. The pain is located to the left upper side of her back. States the pain is a moving pain. She further reports sob.   Denies leg pain/swelling, hemoptysis, recent surgery/trauma, recent long travel, hormone use, personal hx of cancer, or hx of DVT/PE.    Review of Systems  Positive: back Negative: Numbness/weakness, loss of control of bowel or bladder function  Physical Exam  BP 122/84 (BP Location: Left Arm)   Pulse 80   Temp 98.5 F (36.9 C) (Oral)   Resp 18   SpO2 100%  Gen:   Awake, no distress   Resp:  Normal effort  MSK:   Moves extremities without difficulty  Other:  No focal ttp to the back, lungs ctab, heart with rrr  Medical Decision Making  Medically screening exam initiated at 7:02 PM.  Appropriate orders placed.  Lindsey Hunt was informed that the remainder of the evaluation will be completed by another provider, this initial triage assessment does not replace that evaluation, and the importance of remaining in the ED until their evaluation is complete.    Bishop Dublin 01/26/21 Carmel [place], MD 01/27/21 1529

## 2021-01-26 NOTE — ED Notes (Signed)
Pt called for VS, no response x3 

## 2021-01-27 ENCOUNTER — Encounter (HOSPITAL_COMMUNITY): Payer: Self-pay

## 2021-01-27 ENCOUNTER — Other Ambulatory Visit: Payer: Self-pay

## 2021-01-27 LAB — CBG MONITORING, ED: Glucose-Capillary: 97 mg/dL (ref 70–99)

## 2021-01-27 LAB — D-DIMER, QUANTITATIVE: D-Dimer, Quant: 0.41 ug/mL-FEU (ref 0.00–0.50)

## 2021-01-27 MED ORDER — FENTANYL CITRATE (PF) 100 MCG/2ML IJ SOLN
100.0000 ug | INTRAMUSCULAR | Status: DC | PRN
  Administered 2021-01-27: 100 ug via INTRAVENOUS
  Filled 2021-01-27: qty 2

## 2021-01-27 MED ORDER — METHOCARBAMOL 500 MG PO TABS
500.0000 mg | ORAL_TABLET | Freq: Two times a day (BID) | ORAL | 0 refills | Status: DC
Start: 2021-01-27 — End: 2022-04-10

## 2021-01-27 NOTE — Discharge Instructions (Addendum)
Back Pain:  Back pain is very common.  The pain often gets better over time.  The cause of back pain is usually not dangerous.  Most people can learn to manage their back pain on their own.  However if you develop severe or worsening pain, low back pain with fever, numbness, weakness or inability to walk or urinate/stool, you should return to the ER immediately.  Please follow up with your doctor this week for a recheck if still having symptoms.  Low back pain is discomfort in the lower back that may be due to injuries to muscles and ligaments around the spine.  Occasionally, it may be caused by a a problem to a part of the spine called a disc. The pain may last several days or a week;  However, most patients get completely well in 4 weeks.  Medications are also useful to help with pain control.  A commonly prescribed medications includes acetaminophen.  This medication is generally safe, though you should not take more than 8 of the extra strength (500mg ) pills a day.  Please use Tylenol or ibuprofen for pain.  You may use 600 mg ibuprofen every 6 hours or 1000 mg of Tylenol every 6 hours.  You may choose to alternate between the 2.  This would be most effective.  Not to exceed 4 g of Tylenol within 24 hours.  Not to exceed 3200 mg ibuprofen 24 hours.   Non steroidal anti inflammatory medications including Ibuprofen and naproxen;  These medications help both pain and swelling and are very useful in treating back pain.  They should be taken with food, as they can cause stomach upset, and more seriously, stomach bleeding.    Be aware that if you develop new symptoms, such as a fever, leg weakness, difficulty with or loss of control of your urine or bowels, abdominal pain, or more severe pain, you will need to seek medical attention and  / or return to the Emergency department.    Home Care Stay active.  Start with short walks on flat ground if you can.  Try to walk farther each day. Do not sit,  drive or stand in one place for more than 30 minutes.  Do not stay in bed. Do not avoid exercise or work.  Activity can help your back heal faster. Be careful when you bend or lift an object.  Bend at your knees, keep the object close to you, and do not twist. Sleep on a firm mattress.  Lie on your side, and bend your knees.  If you lie on your back, put a pillow under your knees. Only take medicines as told by your doctor. Put ice on the injured area. Put ice in a plastic bag Place a towel between your skin and the bag Leave the ice on for 15-20 minutes, 3-4 times a day for the first 2-3 days. 210 After that, you can switch between ice and heat packs. Ask your doctor about back exercises or massage. Avoid feeling anxious or stressed.  Find good ways to deal with stress, such as exercise.  Get Help Right Way If: Your pain does not go away with rest or medicine. Your pain does not go away in 1 week. You have new problems. You do not feel well. The pain spreads into your legs. You cannot control when you poop (bowel movement) or pee (urinate) You feel sick to your stomach (nauseous) or throw up (vomit) You have belly (abdominal) pain. You feel  like you may pass out (faint). If you develop a fever.  Make Sure you: Understand these instructions. Watch your condition Get help right away if you are not doing well or get worse.

## 2021-01-27 NOTE — ED Provider Notes (Signed)
[facility] EMERGENCY DEPARTMENT Provider Note   CSN: [id number] Arrival date & time: 01/26/21  1841     History Chief Complaint  Patient presents with  . Back Pain    Lindsey Hunt is a 40 y.o. female.  HPI Patient is a 40 year old female with past medical history significant for anemia.  She presented today with achy and occasionally sharp mid to low back pain that she states is been ongoing for 3 days.  Has had the pain seems to move around in her back.  She states that it is persistent but seems to wax and wane.  She denies any heavy lifting new workouts or trauma.  She denies any chest pain shortness of breath lightheadedness or dizziness.  No history of IV drug use, no blood thinners, no cancer history.  She denies any numbness or weakness urinary symptoms or bowel and bladder incontinence.  She states that her pain is worse in her left mid back.  Seems to be worse with movement and touch.  She was seen in urgent care and directed to come to the ER.  Seems that in triage patient did endorse some chest pain and therefore troponins were obtained.  She denies any leg pain or coughing or hemoptysis.  No recent surgeries, hospitalization, long travel, hemoptysis, estrogen containing OCP, cancer history.  No unilateral leg swelling.  No history of PE or VTE.   No cough congestion fevers or chills.  No significant associated symptoms.  No aggravating mitigating factors.  Her pain is 8/10.    Past Medical History:  Diagnosis Date  . Anemia     There are no problems to display for this patient.   Past Surgical History:  Procedure Laterality Date  . BUNIONECTOMY Left      OB History    Gravida  7   Para  6   Term  6   Preterm      AB      Living  6     SAB      IAB      Ectopic      Multiple      Live Births  6           History reviewed. No pertinent family history.  Social History   Tobacco Use  . Smoking status: Former  Research scientist (life sciences)  . Smokeless tobacco: Never Used  Vaping Use  . Vaping Use: Never used  Substance Use Topics  . Alcohol use: Not Currently  . Drug use: No    Home Medications Prior to Admission medications   Medication Sig Start Date End Date Taking? Authorizing Provider  ferrous sulfate 325 (65 FE) MG tablet Take 325 mg by mouth daily as needed (low iron).   Yes [provider]  methocarbamol (ROBAXIN) 500 MG tablet Take 1 tablet (500 mg total) by mouth 2 (two) times daily. 01/27/21  Yes Tedd Sias, PA    Allergies    Patient has no known allergies.  Review of Systems   Review of Systems  Constitutional: Negative for chills and fever.  HENT: Negative for congestion.   Eyes: Negative for pain.  Respiratory: Negative for cough and shortness of breath.   Cardiovascular: Negative for chest pain and leg swelling.  Gastrointestinal: Negative for abdominal pain and vomiting.  Genitourinary: Negative for dysuria.  Musculoskeletal: Positive for back pain. Negative for myalgias.  Skin: Negative for rash.  Neurological: Negative for dizziness and headaches.  Physical Exam Updated Vital Signs BP 124/87   Pulse (!) 54   Temp 98 F (36.7 C) (Oral)   Resp 18   Ht 5\' 6"  (1.676 m)   Wt 93 kg   SpO2 100%   BMI 33.09 kg/m   Physical Exam Vitals and nursing note reviewed.  Constitutional:      General: She is not in acute distress. HENT:     Head: Normocephalic and atraumatic.     Nose: Nose normal.  Eyes:     General: No scleral icterus. Cardiovascular:     Rate and Rhythm: Normal rate and regular rhythm.     Pulses: Normal pulses.     Heart sounds: Normal heart sounds.  Pulmonary:     Effort: Pulmonary effort is normal. No respiratory distress.     Breath sounds: Normal breath sounds. No wheezing.     Comments: Lungs are clear to auscultation all fields. Abdominal:     Palpations: Abdomen is soft.     Tenderness: There is no abdominal tenderness.     Comments:  No tenderness to palpation of the abdomen.  No guarding or rebound.  No CVA tenderness.  Musculoskeletal:     Cervical back: Normal range of motion.     Right lower leg: No edema.     Left lower leg: No edema.     Comments: No focal bony tenderness of the C, T, L-spine.  Some diffuse tenderness to palpation of the left para thoracic vertebral muscular anatomy.  Difficult to find specific trigger points however.  Skin:    General: Skin is warm and dry.     Capillary Refill: Capillary refill takes less than 2 seconds.  Neurological:     Mental Status: She is alert. Mental status is at baseline.  Psychiatric:        Mood and Affect: Mood normal.        Behavior: Behavior normal.     ED Results / Procedures / Treatments   Labs (all labs ordered are listed, but only abnormal results are displayed) Labs Reviewed  BASIC METABOLIC PANEL - Abnormal; Notable for the following components:      Result Value   Potassium 3.2 (*)    Glucose, Bld 107 (*)    BUN 5 (*)    All other components within normal limits  CBC WITH DIFFERENTIAL/PLATELET - Abnormal; Notable for the following components:   Platelets 435 (*)    All other components within normal limits  D-DIMER, QUANTITATIVE  I-STAT BETA HCG BLOOD, ED ([facility], [facility], [facility] ONLY)  CBG MONITORING, ED  TROPONIN I (HIGH SENSITIVITY)  TROPONIN I (HIGH SENSITIVITY)    EKG EKG Interpretation  Date/Time:  Friday Jan 27 2021 07:39:59 EDT Ventricular Rate:  54 PR Interval:  175 QRS Duration: 92 QT Interval:  430 QTC Calculation: 408 R Axis:   34 Text Interpretation: Sinus rhythm No significant change since last tracing Confirmed by Blanchie Dessert [phone]) on 01/27/2021 7:46:30 AM   Radiology DG Chest 2 View  Result Date: 01/26/2021 CLINICAL DATA:  Chest pain for several days EXAM: CHEST - 2 VIEW COMPARISON:  04/14/2020 FINDINGS: Cardiac shadow is stable. Minimal scarring is noted in the left base. No focal infiltrate or effusion is seen. No bony  abnormality is noted. IMPRESSION: No active cardiopulmonary disease. Electronically Signed   By: Inez Catalina M.D.   On: 01/26/2021 19:56    Procedures Procedures   Medications Ordered in ED Medications  fentaNYL (SUBLIMAZE) injection 100  mcg (100 mcg Intravenous Given 01/27/21 0630)    ED Course  I have reviewed the triage vital signs and the nursing notes.  Pertinent labs & imaging results that were available during my care of the patient were reviewed by me and considered in my medical decision making (see chart for details).    MDM Rules/Calculators/A&P                          Patient is a well-appearing 40 year old female she appears uncomfortable in the bed but does not appear to be in any acute distress.  She is complaining of thoracic back pain.  I am able to reproduce some of this with palpation of the paravertebral musculature however she does not have any focal tender trigger points.  She also complains some chest pain in triage although she is not complaining of this now.  She has been in the ER for 14 hours prior to my arrival.  She has received lab work including negative i-STAT hCG.  Normal CBG.  BMP which is unremarkable apart from very mild hypokalemia.  CBC without leukocytosis or anemia.  Patient did indicate that she has some chest pain although very low suspicion for PE or dissection we will obtain D-dimer and has evaluation of this.  If negative no further work-up will be obtained unless patient changes clinically.  Ultimately I suspect muscular injury versus herniated disc or other musculoskeletal cause.  She is not having any midline back pain and she has no history of IV drug use cancer or trauma.  Patient reassessed.  She has 0 pain after 1 dose of fentanyl.  Will discharge home with conservative therapy with muscle relaxer Tylenol and ibuprofen.  She will follow-up with PCP.  Return precautions were given.  Final Clinical Impression(s) / ED Diagnoses Final  diagnoses:  Acute back pain, unspecified back location, unspecified back pain laterality    Rx / DC Orders ED Discharge Orders         Ordered    methocarbamol (ROBAXIN) 500 MG tablet  2 times daily        01/27/21 0856           Tedd Sias, PA 01/27/21 1409    Blanchie Dessert, MD 02/08/21 [phone]

## 2021-01-27 NOTE — ED Notes (Signed)
Patient ambulated from lobby to ED room with steady gait

## 2021-01-27 NOTE — ED Notes (Signed)
Pt. Refuses vital signs.

## 2021-01-27 NOTE — ED Notes (Signed)
Pt refusing VS

## 2021-04-24 ENCOUNTER — Ambulatory Visit
Admission: EM | Admit: 2021-04-24 | Discharge: 2021-04-24 | Disposition: A | Discharge status: Home / Self Care | Payer: Medicaid Other | Attending: Emergency Medicine | Admitting: Emergency Medicine

## 2021-04-24 ENCOUNTER — Encounter: Payer: Self-pay | Admitting: Emergency Medicine

## 2021-04-24 ENCOUNTER — Other Ambulatory Visit: Payer: Self-pay

## 2021-04-24 DIAGNOSIS — R1031 Right lower quadrant pain: Secondary | ICD-10-CM | POA: Diagnosis present

## 2021-04-24 HISTORY — DX: Unspecified ectopic pregnancy without intrauterine pregnancy: O00.90

## 2021-04-24 LAB — POCT URINALYSIS DIP (MANUAL ENTRY)
Bilirubin, UA: NEGATIVE
Blood, UA: NEGATIVE
Glucose, UA: NEGATIVE mg/dL
Ketones, POC UA: NEGATIVE mg/dL
Leukocytes, UA: NEGATIVE
Nitrite, UA: NEGATIVE
Spec Grav, UA: >=1.03 — AB (ref 1.010–1.025)
Urobilinogen, UA: 0.2 E.U./dL
pH, UA: 6 (ref 5.0–8.0)

## 2021-04-24 LAB — POCT URINE PREGNANCY: Preg Test, Ur: NEGATIVE

## 2021-04-24 MED ORDER — POLYETHYLENE GLYCOL 3350 17 G PO PACK: 17.0000 g | PACK | Freq: Every day | ORAL | 0 refills | Status: DC

## 2021-04-24 MED ORDER — ONDANSETRON HCL 4 MG PO TABS: 4.0000 mg | ORAL_TABLET | Freq: Four times a day (QID) | ORAL | 0 refills | Status: DC | PRN

## 2021-04-24 MED ORDER — IBUPROFEN 800 MG PO TABS
800.0000 mg | ORAL_TABLET | Freq: Three times a day (TID) | ORAL | 0 refills | Status: DC
Start: 2021-04-24 — End: 2022-04-10

## 2021-04-24 NOTE — ED Triage Notes (Signed)
Pt here for upper abd pain starting on left side last night but now worse on right with some vomiting; pt sts hx of ectopic pregnancy that felt similar although denies missing her period

## 2021-04-24 NOTE — ED Provider Notes (Signed)
[facility]    CSN: [id number] Arrival date & time: 04/24/21  1241      History   Chief Complaint Chief Complaint  Patient presents with   Abdominal Pain    HPI Lindsey Hunt is a 40 y.o. female.   Patient presents with right lower abdominal beginning last night. Initially started on left side then moved. 3 episodes of vomiting last night, none today. Denies fever, chills, diarrhea, urinary frequency, urgency,discharge, odor, itching,flank pain, back pain, URI symptoms, increased gas production, bloating, heartburn and indigestion, recent travel, consumption of new food or uncooked meats. LBM 8/6. No known sick contact. Vaccinated. LMP 7/28, sexually active. History of ectopic pregnancy described as similar symptoms.   Past Medical History:  Diagnosis Date   Anemia    Ectopic pregnancy     There are no problems to display for this patient.   Past Surgical History:  Procedure Laterality Date   BUNIONECTOMY Left     OB History     Gravida  7   Para  6   Term  6   Preterm      AB      Living  6      SAB      IAB      Ectopic      Multiple      Live Births  6            Home Medications    Prior to Admission medications   Medication Sig Start Date End Date Taking? Authorizing Provider  ibuprofen (ADVIL) 800 MG tablet Take 1 tablet (800 mg total) by mouth 3 (three) times daily. 04/24/21  Yes Gillie Fleites R, NP  ondansetron (ZOFRAN) 4 MG tablet Take 1 tablet (4 mg total) by mouth every 6 (six) hours as needed for nausea or vomiting. 04/24/21  Yes [place] Monds R, NP  polyethylene glycol (MIRALAX MIX-IN PAX) 17 g packet Take 17 g by mouth daily. 04/24/21  Yes Adriana Quinby R, NP  ferrous sulfate 325 (65 FE) MG tablet Take 325 mg by mouth daily as needed (low iron).    [provider]  methocarbamol (ROBAXIN) 500 MG tablet Take 1 tablet (500 mg total) by mouth 2 (two) times daily. Patient not taking: Reported on 04/24/2021  01/27/21   Tedd Sias, PA    Family History History reviewed. No pertinent family history.  Social History Social History   Tobacco Use   Smoking status: Former   Smokeless tobacco: Never  Scientific laboratory technician Use: Never used  Substance Use Topics   Alcohol use: Not Currently   Drug use: No     Allergies   Patient has no known allergies.   Review of Systems Review of Systems  Constitutional: Negative.   HENT: Negative.    Respiratory: Negative.    Cardiovascular: Negative.   Gastrointestinal:  Positive for abdominal pain and vomiting. Negative for abdominal distention, anal bleeding, blood in stool, constipation, diarrhea, nausea and rectal pain.  Neurological: Negative.     Physical Exam Triage Vital Signs ED Triage Vitals  Enc Vitals Group     BP 04/24/21 1336 136/88     Pulse Rate 04/24/21 1336 68     Resp 04/24/21 1336 18     Temp 04/24/21 1336 98 F (36.7 C)     Temp Source 04/24/21 1336 Oral     SpO2 04/24/21 1336 95 %     Weight --  Height --      Head Circumference --      Peak Flow --      Pain Score 04/24/21 1337 8     Pain Loc --      Pain Edu? --      Excl. in [place]? --    No data found.  Updated Vital Signs BP 136/88 (BP Location: Right Arm)   Pulse 68   Temp 98 F (36.7 C) (Oral)   Resp 18   LMP 04/13/2021   SpO2 95%   Visual Acuity Right Eye Distance:   Left Eye Distance:   Bilateral Distance:    Right Eye Near:   Left Eye Near:    Bilateral Near:     Physical Exam Constitutional:      Appearance: She is well-developed and normal weight.  HENT:     Head: Normocephalic.  Eyes:     Extraocular Movements: Extraocular movements intact.  Pulmonary:     Effort: Pulmonary effort is normal.  Abdominal:     General: Abdomen is flat.     Palpations: Abdomen is soft.     Tenderness: There is abdominal tenderness in the right lower quadrant. There is no right CVA tenderness, left CVA tenderness, guarding or rebound.      Hernia: No hernia is present.  Skin:    General: Skin is warm and dry.  Neurological:     General: No focal deficit present.     Mental Status: She is alert and oriented to person, place, and time.  Psychiatric:        Mood and Affect: Mood normal.        Behavior: Behavior normal.     UC Treatments / Results  Labs (all labs ordered are listed, but only abnormal results are displayed) Labs Reviewed  POCT URINALYSIS DIP (MANUAL ENTRY) - Abnormal; Notable for the following components:      Result Value   Spec Grav, UA >=1.030 (*)    Protein Ur, POC trace (*)    All other components within normal limits  POCT URINE PREGNANCY  CERVICOVAGINAL ANCILLARY ONLY    EKG   Radiology No results found.  Procedures Procedures (including critical care time)  Medications Ordered in UC Medications - No data to display  Initial Impression / Assessment and Plan / UC Course  I have reviewed the triage vital signs and the nursing notes.  Pertinent labs & imaging results that were available during my care of the patient were reviewed by me and considered in my medical decision making (see chart for details).  Clinical Course as of 04/24/21 1429  Mon Apr 24, 2021  1352 Specific Gravity, UA(!): >=1.030 [AW]    Clinical Course User Index [AW] Hans Eden, NP    Right lower quadrant pain   Discussed with patient potential causes of gastritis vs ectopic pregnancy vs appendicitis, discussed capabilities of imaging at urgent care, wanting to treat conservatively at this time, strict follow up precautions given for worsening abdominal pain for evaluation at nearest ED  Urine pregnancy negative Urinalysis negative Zofran 4 mg every 6 hours prn Ibuprofen 800 mg every 8 hrs prn Sti screening pending, will treat per protocol, advised abstinence until results return and/or treatment complete  Miralax 17 g daily prn  Final Clinical Impressions(s) / UC Diagnoses   Final diagnoses:   Right lower quadrant abdominal pain     Discharge Instructions      Your pregnancy test and urinalysis were  negative   Labs pending 2-3 days, you will be contacted if positive for any sti and treatment will be sent to the pharmacy, you will have to return to the clinic if positive for gonorrhea to receive treatment   Please refrain from having sex until labs results, if positive please refrain from having sex until treatment complete and symptoms resolve   If positive for Chlamydia  gonorrhea or trichomoniasis please notify partner or partners so they may tested as well  Can use ibuprofen every 8 hours for pain  Can use zofran every 6 hours for nausea  Can take miralax, place 4-6 oz of water or juice, twice a day as needed, can stop use after bowel movement occurs   At any point if pain worsens go to nearest emergency department, if pain persist go to med center or contact primary doctor for evaluation     ED Prescriptions     Medication Sig Dispense Auth. Provider   ondansetron (ZOFRAN) 4 MG tablet Take 1 tablet (4 mg total) by mouth every 6 (six) hours as needed for nausea or vomiting. 20 tablet Millette Halberstam R, NP   ibuprofen (ADVIL) 800 MG tablet Take 1 tablet (800 mg total) by mouth 3 (three) times daily. 21 tablet Ammy Lienhard R, NP   polyethylene glycol (MIRALAX MIX-IN PAX) 17 g packet Take 17 g by mouth daily. 14 each Liese Dizdarevic, Leitha Schuller, NP      PDMP not reviewed this encounter.   Hans Eden, NP 04/24/21 1446

## 2021-04-24 NOTE — Discharge Instructions (Addendum)
Your pregnancy test and urinalysis were negative   Labs pending 2-3 days, you will be contacted if positive for any sti and treatment will be sent to the pharmacy, you will have to return to the clinic if positive for gonorrhea to receive treatment   Please refrain from having sex until labs results, if positive please refrain from having sex until treatment complete and symptoms resolve   If positive for Chlamydia  gonorrhea or trichomoniasis please notify partner or partners so they may tested as well  Can use ibuprofen every 8 hours for pain  Can use zofran every 6 hours for nausea  Can take miralax, place 4-6 oz of water or juice, twice a day as needed, can stop use after bowel movement occurs   At any point if pain worsens go to nearest emergency department, if pain persist go to med center or contact primary doctor for evaluation

## 2021-04-25 LAB — CERVICOVAGINAL ANCILLARY ONLY
Bacterial Vaginitis (gardnerella): POSITIVE — AB
Candida Glabrata: NEGATIVE
Candida Vaginitis: NEGATIVE
Chlamydia: NEGATIVE
Comment: NEGATIVE
Comment: NEGATIVE
Comment: NEGATIVE
Comment: NEGATIVE
Comment: NEGATIVE
Comment: NORMAL
Neisseria Gonorrhea: NEGATIVE
Trichomonas: NEGATIVE

## 2021-04-28 ENCOUNTER — Telehealth (HOSPITAL_COMMUNITY): Payer: Self-pay | Admitting: Emergency Medicine

## 2021-04-28 MED ORDER — METRONIDAZOLE 500 MG PO TABS
500.0000 mg | ORAL_TABLET | Freq: Two times a day (BID) | ORAL | 0 refills | Status: DC
Start: 2021-04-28 — End: 2022-04-10

## 2022-01-12 DIAGNOSIS — Z13220 Encounter for screening for lipoid disorders: Secondary | ICD-10-CM | POA: Diagnosis not present

## 2022-01-12 DIAGNOSIS — D509 Iron deficiency anemia, unspecified: Secondary | ICD-10-CM | POA: Diagnosis not present

## 2022-01-12 DIAGNOSIS — R1032 Left lower quadrant pain: Secondary | ICD-10-CM | POA: Diagnosis not present

## 2022-01-16 ENCOUNTER — Other Ambulatory Visit: Payer: Self-pay | Admitting: Family Medicine

## 2022-01-16 DIAGNOSIS — Z1231 Encounter for screening mammogram for malignant neoplasm of breast: Secondary | ICD-10-CM

## 2022-02-01 DIAGNOSIS — Z124 Encounter for screening for malignant neoplasm of cervix: Secondary | ICD-10-CM | POA: Diagnosis not present

## 2022-02-01 DIAGNOSIS — R1032 Left lower quadrant pain: Secondary | ICD-10-CM | POA: Diagnosis not present

## 2022-04-10 ENCOUNTER — Inpatient Hospital Stay (HOSPITAL_COMMUNITY): Payer: Medicaid Other

## 2022-04-10 ENCOUNTER — Inpatient Hospital Stay (HOSPITAL_COMMUNITY)
Admission: AD | Admit: 2022-04-10 | Discharge: 2022-04-10 | Disposition: A | Discharge status: Home / Self Care | Payer: Medicaid Other | Attending: Obstetrics and Gynecology | Admitting: Obstetrics and Gynecology

## 2022-04-10 ENCOUNTER — Other Ambulatory Visit: Payer: Self-pay

## 2022-04-10 ENCOUNTER — Encounter (HOSPITAL_COMMUNITY): Payer: Self-pay | Admitting: Obstetrics and Gynecology

## 2022-04-10 DIAGNOSIS — R109 Unspecified abdominal pain: Secondary | ICD-10-CM | POA: Diagnosis not present

## 2022-04-10 DIAGNOSIS — Z3A01 Less than 8 weeks gestation of pregnancy: Secondary | ICD-10-CM | POA: Diagnosis not present

## 2022-04-10 DIAGNOSIS — O3680X0 Pregnancy with inconclusive fetal viability, not applicable or unspecified: Secondary | ICD-10-CM | POA: Diagnosis not present

## 2022-04-10 DIAGNOSIS — O26891 Other specified pregnancy related conditions, first trimester: Secondary | ICD-10-CM

## 2022-04-10 LAB — HCG, QUANTITATIVE, PREGNANCY: hCG, Beta Chain, Quant, S: 3596 m[IU]/mL — ABNORMAL HIGH (ref <5)

## 2022-04-10 LAB — CBC
HCT: 37 % (ref 36.0–46.0)
Hemoglobin: 11.7 g/dL — ABNORMAL LOW (ref 12.0–15.0)
MCH: 27.2 pg (ref 26.0–34.0)
MCHC: 31.6 g/dL (ref 30.0–36.0)
MCV: 86 fL (ref 80.0–100.0)
Platelets: 380 10*3/uL (ref 150–400)
RBC: 4.3 MIL/uL (ref 3.87–5.11)
RDW: 15.8 % — ABNORMAL HIGH (ref 11.5–15.5)
WBC: 4.9 10*3/uL (ref 4.0–10.5)
nRBC: 0 % (ref 0.0–0.2)

## 2022-04-10 LAB — URINALYSIS, ROUTINE W REFLEX MICROSCOPIC
Bilirubin Urine: NEGATIVE
Glucose, UA: NEGATIVE mg/dL
Hgb urine dipstick: NEGATIVE
Ketones, ur: NEGATIVE mg/dL
Leukocytes,Ua: NEGATIVE
Nitrite: NEGATIVE
Protein, ur: NEGATIVE mg/dL
Specific Gravity, Urine: 1.018 (ref 1.005–1.030)
pH: 6 (ref 5.0–8.0)

## 2022-04-10 LAB — POCT PREGNANCY, URINE: Preg Test, Ur: POSITIVE — AB

## 2022-04-10 NOTE — MAU Provider Note (Cosign Needed Addendum)
History     CSN: [id number]  Arrival date and time: 04/10/22 1607   Event Date/Time   First Provider Initiated Contact with Patient 04/10/22 1830      Chief Complaint  Patient presents with   Abdominal Pain   Abdominal Pain This is a new problem. Episode onset: Sunday. The problem occurs constantly. The problem has been unchanged. The pain is located in the RLQ and LLQ. The pain is at a severity of 8/10. The pain is severe. The quality of the pain is aching and sharp. Pertinent negatives include no constipation, diarrhea, dysuria, fever, headaches, nausea or vomiting. She has tried nothing for the symptoms. hx of a prior ectopic pregnancy treated with medicaiton.    OB History     Gravida  9   Para  6   Term  6   Preterm      AB  2   Living  6      SAB  1   IAB      Ectopic  1   Multiple      Live Births  6           Past Medical History:  Diagnosis Date   Anemia    Ectopic pregnancy     Past Surgical History:  Procedure Laterality Date   BUNIONECTOMY Left     No family history on file.  Social History   Tobacco Use   Smoking status: Former   Smokeless tobacco: Never  Scientific laboratory technician Use: Never used  Substance Use Topics   Alcohol use: Not Currently   Drug use: No    Allergies: No Known Allergies  Medications Prior to Admission  Medication Sig Dispense Refill Last Dose   ferrous sulfate 325 (65 FE) MG tablet Take 325 mg by mouth daily as needed (low iron).      ibuprofen (ADVIL) 800 MG tablet Take 1 tablet (800 mg total) by mouth 3 (three) times daily. 21 tablet 0    methocarbamol (ROBAXIN) 500 MG tablet Take 1 tablet (500 mg total) by mouth 2 (two) times daily. (Patient not taking: Reported on 04/24/2021) 20 tablet 0    metroNIDAZOLE (FLAGYL) 500 MG tablet Take 1 tablet (500 mg total) by mouth 2 (two) times daily. 14 tablet 0    ondansetron (ZOFRAN) 4 MG tablet Take 1 tablet (4 mg total) by mouth every 6 (six) hours as needed for  nausea or vomiting. 20 tablet 0    polyethylene glycol (MIRALAX MIX-IN PAX) 17 g packet Take 17 g by mouth daily. 14 each 0     Review of Systems  Constitutional:  Negative for appetite change and fever.  Respiratory:  Negative for shortness of breath.   Cardiovascular:  Negative for chest pain.  Gastrointestinal:  Positive for abdominal pain. Negative for constipation, diarrhea, nausea and vomiting.  Genitourinary:  Negative for dysuria.  Neurological:  Negative for headaches.   Physical Exam   Blood pressure 135/78, pulse 72, resp. rate 14, height '5\' 6"'$  (1.676 m), weight 91.1 kg, last menstrual period 03/03/2022, SpO2 100 %, unknown if currently breastfeeding.  Physical Exam Vitals and nursing note reviewed.  HENT:     Head: Normocephalic.  Eyes:     General: No scleral icterus.    Extraocular Movements: Extraocular movements intact.  Cardiovascular:     Rate and Rhythm: Normal rate and regular rhythm.     Heart sounds: Normal heart sounds.  Pulmonary:     Effort:  Pulmonary effort is normal.     Breath sounds: Normal breath sounds.  Abdominal:     Palpations: Abdomen is soft. There is no mass.     Tenderness: There is abdominal tenderness in the right lower quadrant and left lower quadrant. There is no guarding or rebound.  Skin:    Findings: No rash.  Neurological:     Mental Status: She is alert and oriented to person, place, and time.  Psychiatric:        Mood and Affect: Mood normal.        Behavior: Behavior normal.     MAU Course  Procedures  MDM  1830- +UPT reviewed. UA pending. Will obtain CBC, TVUS, and bHCG. Concern for ectopic pregnancy v. Normal cramping in early pregnancy.   1920- Reviewed UA. WNL.  34- Patient in [place].   Care report given to Dr. Cy Blamer for further management.      Simone Autry-Lott 04/10/2022, 7:47 PM  7:52PM Care taken over my be - Dr. Cy Blamer [place] results back (listed below) Intrauterine gestational sac: Single   Yolk sac:  Not  Visualized.   Embryo:  Not Visualized.   Cardiac Activity: Not Visualized.   MSD: 4.6 mm   5 w   2 d   Subchorionic hemorrhage:  None visualized.   Maternal uterus/adnexae: Bilateral ovaries are within normal limits.   No free fluid.   IMPRESSION: Single intrauterine gestational sac, measuring 5 weeks 2 days by mean sac diameter, without yolk sac or fetal pole. Follow-up pelvic ultrasound is suggested in 10-14 days to confirm viability, as clinically warranted.  HCG 3596  Given workup likely early pregnancy with GS but no YS or embryo yet and no cardiac activity yet. Stable for discharge with plan for follow up serial hcg and follow up viability scan in 14 days  Assessment and Plan  Pregnancy of unknown location Abdominal pain in early pregnancy [redacted] week gestation - plan for 48 hr HCG (scheduled at [facility]) - plan for follow up viability [place] in 14 days (message sent to [facility] scheduling pool) - return precautions discussed with patient in detail  Renard Matter, MD, MPH OB Fellow, Faculty Practice

## 2022-04-10 NOTE — MAU Note (Signed)
...  Lindsey Hunt is a 41 y.o. at approximately 103w3dhere in MAU reporting: Intermittent sharp bilateral pain that is worse with movement. She reports this started this past Sunday, 7/23. She reports since then her pain has progressively gotten worse. Denies VB or LOF. Denies recent IC. Denies vaginal itching, vaginal discharge, and vaginal odors. Denies burning with urination, urinary frequency, and strong smelling urine.  LMP: 03/03/2022 Onset of complaint: 7/23 Pain score: 8/10 bilateral lower abdomen  Lab orders placed from triage:  POCT Pregnancy, UA

## 2022-04-10 NOTE — Discharge Instructions (Signed)
You came to the MAU because you had abdominal pain. We did an ultrasound that shows you are about [redacted] weeks pregnant. We couldn't see much on the ultrasound since you are so early so you will have repeat of your pregnancy labs in 48 hours (scheduled at the office) and also another ultrasound in 14 days (someone from the office will call to schedule that for you).  Please seek care if you have vaginal bleeding or worsening of your abdominal pain.

## 2022-04-11 ENCOUNTER — Telehealth: Payer: Self-pay | Admitting: Family Medicine

## 2022-04-11 NOTE — Telephone Encounter (Signed)
Called patient to schedule ultrasound there was no answer to the phone call so a voicemail was left and a letter was mailed.

## 2022-04-12 ENCOUNTER — Ambulatory Visit (INDEPENDENT_AMBULATORY_CARE_PROVIDER_SITE_OTHER): Payer: Medicaid Other

## 2022-04-12 VITALS — BP 118/70 | HR 79 | Wt 202.0 lb

## 2022-04-12 DIAGNOSIS — R109 Unspecified abdominal pain: Secondary | ICD-10-CM | POA: Diagnosis not present

## 2022-04-12 DIAGNOSIS — O26891 Other specified pregnancy related conditions, first trimester: Secondary | ICD-10-CM | POA: Diagnosis not present

## 2022-04-12 NOTE — Progress Notes (Signed)
Beta HCG Follow-up Visit  Lindsey Hunt presents to [facility] for follow-up beta HCG lab. She was seen in MAU for abdominal pain on 04/10/22. Patient reports continued abdominal pain today. Patient denies vaginal bleeding. Discussed with patient that we are following beta HCG levels today. Valid contact number for patient confirmed. I explained to patient her results will come back after the clinic has closed but that she will be notified of results. Patient verbalized understanding.   Beta HCG results:  04/10/22  HCG 3,596   04/12/22  HCG pending     Seth Bake 04/12/2022

## 2022-04-13 ENCOUNTER — Telehealth: Payer: Self-pay | Admitting: Obstetrics and Gynecology

## 2022-04-13 LAB — BETA HCG QUANT (REF LAB): hCG Quant: 3254 m[IU]/mL

## 2022-04-13 NOTE — Telephone Encounter (Signed)
Received a call from [place] regarding STAT HCG results of 3254 from 04/12/22  Patient with history of ectopic pregnancy seen in MAU on 04/10/22 with abdominal pain and quant HCG 3596. Pelvic ultrasound performed that day revealed an early gestational sac without yolk sac or fetal pole  Patient contacted by phone and results reviewed. She reports persistent abdominal pain unchanged from previous. She denies any vaginal bleeding. Explained to patient that this is not a normal pregnancy given decrease in quant HCG. Advised to come to MAU for further evaluation given persistent pain

## 2022-04-15 ENCOUNTER — Inpatient Hospital Stay (HOSPITAL_COMMUNITY): Payer: Medicaid Other

## 2022-04-15 ENCOUNTER — Inpatient Hospital Stay (HOSPITAL_COMMUNITY)
Admission: AD | Admit: 2022-04-15 | Discharge: 2022-04-15 | Disposition: A | Discharge status: Home / Self Care | Payer: Medicaid Other | Attending: Family Medicine | Admitting: Family Medicine

## 2022-04-15 ENCOUNTER — Telehealth: Payer: Self-pay | Admitting: Certified Nurse Midwife

## 2022-04-15 DIAGNOSIS — O09521 Supervision of elderly multigravida, first trimester: Secondary | ICD-10-CM | POA: Diagnosis present

## 2022-04-15 DIAGNOSIS — O0941 Supervision of pregnancy with grand multiparity, first trimester: Secondary | ICD-10-CM | POA: Insufficient documentation

## 2022-04-15 DIAGNOSIS — R109 Unspecified abdominal pain: Secondary | ICD-10-CM | POA: Diagnosis present

## 2022-04-15 DIAGNOSIS — O0289 Other abnormal products of conception: Secondary | ICD-10-CM

## 2022-04-15 DIAGNOSIS — Z3A01 Less than 8 weeks gestation of pregnancy: Secondary | ICD-10-CM

## 2022-04-15 LAB — COMPREHENSIVE METABOLIC PANEL
ALT: 12 U/L (ref 0–44)
AST: 13 U/L — ABNORMAL LOW (ref 15–41)
Albumin: 3.2 g/dL — ABNORMAL LOW (ref 3.5–5.0)
Alkaline Phosphatase: 63 U/L (ref 38–126)
Anion gap: 4 — ABNORMAL LOW (ref 5–15)
BUN: 5 mg/dL — ABNORMAL LOW (ref 6–20)
CO2: 23 mmol/L (ref 22–32)
Calcium: 8.8 mg/dL — ABNORMAL LOW (ref 8.9–10.3)
Chloride: 110 mmol/L (ref 98–111)
Creatinine, Ser: 0.76 mg/dL (ref 0.44–1.00)
GFR, Estimated: >60 mL/min (ref >60)
Glucose, Bld: 102 mg/dL — ABNORMAL HIGH (ref 70–99)
Potassium: 3.7 mmol/L (ref 3.5–5.1)
Sodium: 137 mmol/L (ref 135–145)
Total Bilirubin: 0.9 mg/dL (ref 0.3–1.2)
Total Protein: 6.7 g/dL (ref 6.5–8.1)

## 2022-04-15 LAB — CBC WITH DIFFERENTIAL/PLATELET
Abs Immature Granulocytes: 0.02 10*3/uL (ref 0.00–0.07)
Basophils Absolute: 0 10*3/uL (ref 0.0–0.1)
Basophils Relative: 1 %
Eosinophils Absolute: 0.1 10*3/uL (ref 0.0–0.5)
Eosinophils Relative: 3 %
HCT: 35.4 % — ABNORMAL LOW (ref 36.0–46.0)
Hemoglobin: 11.4 g/dL — ABNORMAL LOW (ref 12.0–15.0)
Immature Granulocytes: 0 %
Lymphocytes Relative: 32 %
Lymphs Abs: 1.6 10*3/uL (ref 0.7–4.0)
MCH: 27.7 pg (ref 26.0–34.0)
MCHC: 32.2 g/dL (ref 30.0–36.0)
MCV: 85.9 fL (ref 80.0–100.0)
Monocytes Absolute: 0.4 10*3/uL (ref 0.1–1.0)
Monocytes Relative: 9 %
Neutro Abs: 2.7 10*3/uL (ref 1.7–7.7)
Neutrophils Relative %: 55 %
Platelets: 381 10*3/uL (ref 150–400)
RBC: 4.12 MIL/uL (ref 3.87–5.11)
RDW: 15.7 % — ABNORMAL HIGH (ref 11.5–15.5)
WBC: 4.9 10*3/uL (ref 4.0–10.5)
nRBC: 0 % (ref 0.0–0.2)

## 2022-04-15 LAB — WET PREP, GENITAL
Sperm: NONE SEEN
Trich, Wet Prep: NONE SEEN
WBC, Wet Prep HPF POC: <10 (ref <10)
Yeast Wet Prep HPF POC: NONE SEEN

## 2022-04-15 LAB — HCG, QUANTITATIVE, PREGNANCY: hCG, Beta Chain, Quant, S: 9527 m[IU]/mL — ABNORMAL HIGH (ref <5)

## 2022-04-15 NOTE — MAU Note (Signed)
Lindsey Hunt is a 41 y.o. at 67w1dhere in MAU reporting: here for a follow up.  Had a preg test and blood work on Thursday, was told she needed to come every 48 hrs. No bleeding. Still cramping, both side in lower abd. Denies GI or GU complaints.   Onset of complaint: ongoing Pain score: moderate There were no vitals filed for this visit.    Lab orders placed from triage:

## 2022-04-15 NOTE — MAU Note (Signed)
Pt has a child with her.  Policy explained, unable to go to [place] and can not be left alone in lobby.  ? Can someone come get child.

## 2022-04-15 NOTE — MAU Provider Note (Signed)
History     CSN: [id number]  Arrival date and time: 04/15/22 1155   Event Date/Time   First Provider Initiated Contact with Patient 04/15/22 1340      Chief Complaint  Patient presents with   Follow-up   Lindsey Hunt a  41 y.o. [id number] at 32w1dpresents to MAU conversation with Dr. CElly Modenato be reevaluated with persistent abdominal pain with an abnormal quant.   Patient was seen in MAU on 7/25 for similar complaints. Resulted in a Gestational sac without yolk sac or fetal pole. The recommendation was for serial quant with a viability scan. Repeat quant was mildly decreased suggestive of abnormal pregnancy.             OB History     Gravida  9   Para  6   Term  6   Preterm      AB  2   Living  6      SAB  1   IAB      Ectopic  1   Multiple      Live Births  6           Past Medical History:  Diagnosis Date   Anemia    Ectopic pregnancy     Past Surgical History:  Procedure Laterality Date   BUNIONECTOMY Left     No family history on file.  Social History   Tobacco Use   Smoking status: Former   Smokeless tobacco: Never  VScientific laboratory technicianUse: Never used  Substance Use Topics   Alcohol use: Not Currently   Drug use: No    Allergies: No Known Allergies  No medications prior to admission.    Review of Systems  Constitutional:  Negative for chills, fatigue and fever.  Respiratory:  Negative for shortness of breath.   Gastrointestinal:  Positive for abdominal pain.  Genitourinary:  Negative for vaginal bleeding, vaginal discharge and vaginal pain.  Neurological:  Negative for weakness, light-headedness and headaches.  Notes abdominal pain as intermittent and unchanged from previous visit. Denies vaginal bleeding. Not taking anything for pain.   Physical Exam   Blood pressure 123/86, pulse 72, temperature 98.3 F (36.8 C), temperature source Oral, resp. rate 17, height '5\' 6"'$  (1.676 m), weight 91.7 kg, last  menstrual period 03/03/2022, SpO2 100 %, unknown if currently breastfeeding.  Physical Exam Vitals and nursing note reviewed.  Constitutional:      General: She is not in acute distress.    Appearance: Normal appearance.  HENT:     Head: Normocephalic.  Pulmonary:     Effort: Pulmonary effort is normal.  Musculoskeletal:     Cervical back: Normal range of motion.  Skin:    General: Skin is warm and dry.  Neurological:     Mental Status: She is alert and oriented to person, place, and time.  Psychiatric:     Comments: Patient was agitated during MSE.     MAU Course  Procedures Lab Orders         Wet prep, genital         CBC with Differential/Platelet         Comprehensive metabolic panel         hCG, quantitative, pregnancy    TVUS ordered today.   CNM made aware that patient is becoming upset and desires to leave.   CNM to call patient from lobby and patient has 41year old present. Patient desires to leave.  CNM discussed that results are still pending and that it may be a while. Patient does not have someone to watch child for ultrasound today. CNM discussed results from previous visit and what that meant for todays plan of care. Through shared decision making and based on stability of current clinical presentation CNM provided the option for outpatient plan of care vs. Waiting for childcare and result. Patient desires to leave and be notified of results outpatient. Patient visibly appears stable, pain unchanged from previous visit. Labs have been collected including repeat quant. CNM discharged patient in stable condition.   - 1:50pm: CNM consulted Dr. Kennon Rounds for plan of care. Per MD D/t previous history of ectopic pregnancy, there is more concern for ectopic with this pregnancy. Discussed Failed pregnancy vs. Ectopic. And management options. Plan to establish Plan of care once pending quant results.       MDM: Labs reviewed and interpreted by me.  Results for orders placed or  performed during the hospital encounter of 04/15/22 (from the past 24 hour(s))  CBC with Differential/Platelet     Status: Abnormal   Collection Time: 04/15/22  1:04 PM  Result Value Ref Range   WBC 4.9 4.0 - 10.5 K/uL   RBC 4.12 3.87 - 5.11 MIL/uL   Hemoglobin 11.4 (L) 12.0 - 15.0 g/dL   HCT 35.4 (L) 36.0 - 46.0 %   MCV 85.9 80.0 - 100.0 fL   MCH 27.7 26.0 - 34.0 pg   MCHC 32.2 30.0 - 36.0 g/dL   RDW 15.7 (H) 11.5 - 15.5 %   Platelets 381 150 - 400 K/uL   nRBC 0.0 0.0 - 0.2 %   Neutrophils Relative % 55 %   Neutro Abs 2.7 1.7 - 7.7 K/uL   Lymphocytes Relative 32 %   Lymphs Abs 1.6 0.7 - 4.0 K/uL   Monocytes Relative 9 %   Monocytes Absolute 0.4 0.1 - 1.0 K/uL   Eosinophils Relative 3 %   Eosinophils Absolute 0.1 0.0 - 0.5 K/uL   Basophils Relative 1 %   Basophils Absolute 0.0 0.0 - 0.1 K/uL   Immature Granulocytes 0 %   Abs Immature Granulocytes 0.02 0.00 - 0.07 K/uL  Comprehensive metabolic panel     Status: Abnormal   Collection Time: 04/15/22  1:04 PM  Result Value Ref Range   Sodium 137 135 - 145 mmol/L   Potassium 3.7 3.5 - 5.1 mmol/L   Chloride 110 98 - 111 mmol/L   CO2 23 22 - 32 mmol/L   Glucose, Bld 102 (H) 70 - 99 mg/dL   BUN 5 (L) 6 - 20 mg/dL   Creatinine, Ser 0.76 0.44 - 1.00 mg/dL   Calcium 8.8 (L) 8.9 - 10.3 mg/dL   Total Protein 6.7 6.5 - 8.1 g/dL   Albumin 3.2 (L) 3.5 - 5.0 g/dL   AST 13 (L) 15 - 41 U/L   ALT 12 0 - 44 U/L   Alkaline Phosphatase 63 38 - 126 U/L   Total Bilirubin 0.9 0.3 - 1.2 mg/dL   GFR, Estimated >60 >60 mL/min   Anion gap 4 (L) 5 - 15  hCG, quantitative, pregnancy     Status: Abnormal   Collection Time: 04/15/22  1:07 PM  Result Value Ref Range   hCG, Beta Chain, Quant, S 9,527 (H) <5 mIU/mL  Wet prep, genital     Status: Abnormal   Collection Time: 04/15/22  1:18 PM  Result Value Ref Range   Yeast Wet Prep HPF  POC NONE SEEN NONE SEEN   Trich, Wet Prep NONE SEEN NONE SEEN   Clue Cells Wet Prep HPF POC PRESENT (A) NONE SEEN    WBC, Wet Prep HPF POC <10 <10   Sperm NONE SEEN     Assessment and Plan   1. Non-viable pregnancy   2. [redacted] weeks gestation of pregnancy   - Strict Ectopic Pain and bleeding return precautions reviewed with patient.  - Patient discharged home in visibly stable condition.   Jacquiline Doe, MSN CNM  04/15/2022, 1:45 PM    - 4:30 PM Repeat quant from today doubled. Call made to patient to discuss the result and importance of a repeat TVUS as soon as possible. Please see note from telephone encounter.   Message sent  to [facility] to schedule a [place].   Lindsey Heldman Isaias Sakai) Rollene Rotunda, MSN, [place] for [facility]  04/15/22 4:32 PM

## 2022-04-15 NOTE — Telephone Encounter (Signed)
Call placed to Patient to discuss BHCG levels from 04/15/22 MAU visit. 2- patient identifiers gathered prior to proceeding. Patient confirmed. Discussed that BHCG levels were elevated an that this is Not a failed pregnancy.    BHCG:  04/10/22: 3596 04/13/22: 3254 04/15/22: 9527   The recommendation is to have a [place] ASAP. Patient notified that the office will contact her to schedule a TVUS as soon as possible. Patient verbalized understanding.     Message sent to high Priority to [facility]-Admin for [place].   Katharina Jehle Isaias Sakai) Rollene Rotunda, MSN, [place] for [facility]  04/15/22 3:50 PM

## 2022-04-16 LAB — GC/CHLAMYDIA PROBE AMP (~~LOC~~) NOT AT ARMC
Chlamydia: NEGATIVE
Comment: NEGATIVE
Comment: NORMAL
Neisseria Gonorrhea: NEGATIVE

## 2022-04-23 ENCOUNTER — Other Ambulatory Visit: Payer: Self-pay | Admitting: Family Medicine

## 2022-04-23 ENCOUNTER — Inpatient Hospital Stay (HOSPITAL_COMMUNITY): Payer: Medicaid Other

## 2022-04-23 ENCOUNTER — Encounter (HOSPITAL_COMMUNITY): Payer: Self-pay | Admitting: Obstetrics & Gynecology

## 2022-04-23 ENCOUNTER — Inpatient Hospital Stay (HOSPITAL_COMMUNITY)
Admission: AD | Admit: 2022-04-23 | Discharge: 2022-04-23 | Disposition: A | Discharge status: Home / Self Care | Payer: Medicaid Other | Attending: Obstetrics & Gynecology | Admitting: Obstetrics & Gynecology

## 2022-04-23 DIAGNOSIS — K224 Dyskinesia of esophagus: Secondary | ICD-10-CM

## 2022-04-23 DIAGNOSIS — O99611 Diseases of the digestive system complicating pregnancy, first trimester: Secondary | ICD-10-CM | POA: Diagnosis not present

## 2022-04-23 DIAGNOSIS — O26891 Other specified pregnancy related conditions, first trimester: Secondary | ICD-10-CM | POA: Diagnosis not present

## 2022-04-23 DIAGNOSIS — O219 Vomiting of pregnancy, unspecified: Secondary | ICD-10-CM | POA: Diagnosis not present

## 2022-04-23 DIAGNOSIS — Z3A01 Less than 8 weeks gestation of pregnancy: Secondary | ICD-10-CM | POA: Insufficient documentation

## 2022-04-23 LAB — LIPASE, BLOOD: Lipase: 29 U/L (ref 11–51)

## 2022-04-23 LAB — COMPREHENSIVE METABOLIC PANEL
ALT: 20 U/L (ref 0–44)
AST: 21 U/L (ref 15–41)
Albumin: 3.4 g/dL — ABNORMAL LOW (ref 3.5–5.0)
Alkaline Phosphatase: 62 U/L (ref 38–126)
Anion gap: 10 (ref 5–15)
BUN: 6 mg/dL (ref 6–20)
CO2: 24 mmol/L (ref 22–32)
Calcium: 8.7 mg/dL — ABNORMAL LOW (ref 8.9–10.3)
Chloride: 104 mmol/L (ref 98–111)
Creatinine, Ser: 0.91 mg/dL (ref 0.44–1.00)
GFR, Estimated: >60 mL/min (ref >60)
Glucose, Bld: 107 mg/dL — ABNORMAL HIGH (ref 70–99)
Potassium: 3.4 mmol/L — ABNORMAL LOW (ref 3.5–5.1)
Sodium: 138 mmol/L (ref 135–145)
Total Bilirubin: 0.7 mg/dL (ref 0.3–1.2)
Total Protein: 6.8 g/dL (ref 6.5–8.1)

## 2022-04-23 LAB — URINALYSIS, ROUTINE W REFLEX MICROSCOPIC
Bilirubin Urine: NEGATIVE
Glucose, UA: NEGATIVE mg/dL
Hgb urine dipstick: NEGATIVE
Ketones, ur: NEGATIVE mg/dL
Leukocytes,Ua: NEGATIVE
Nitrite: NEGATIVE
Protein, ur: 30 mg/dL — AB
Specific Gravity, Urine: 1.026 (ref 1.005–1.030)
pH: 5 (ref 5.0–8.0)

## 2022-04-23 LAB — CBC
HCT: 33.5 % — ABNORMAL LOW (ref 36.0–46.0)
Hemoglobin: 11.1 g/dL — ABNORMAL LOW (ref 12.0–15.0)
MCH: 27.7 pg (ref 26.0–34.0)
MCHC: 33.1 g/dL (ref 30.0–36.0)
MCV: 83.5 fL (ref 80.0–100.0)
Platelets: 387 10*3/uL (ref 150–400)
RBC: 4.01 MIL/uL (ref 3.87–5.11)
RDW: 15.2 % (ref 11.5–15.5)
WBC: 8.2 10*3/uL (ref 4.0–10.5)
nRBC: 0 % (ref 0.0–0.2)

## 2022-04-23 LAB — HCG, QUANTITATIVE, PREGNANCY: hCG, Beta Chain, Quant, S: 24643 m[IU]/mL — ABNORMAL HIGH (ref <5)

## 2022-04-23 MED ORDER — METOCLOPRAMIDE HCL 10 MG PO TABS
10.0000 mg | ORAL_TABLET | Freq: Once | ORAL | Status: AC
  Administered 2022-04-23: 10 mg via ORAL
  Filled 2022-04-23: qty 1

## 2022-04-23 MED ORDER — LIDOCAINE VISCOUS HCL 2 % MT SOLN
15.0000 mL | Freq: Once | OROMUCOSAL | Status: AC
  Administered 2022-04-23: 15 mL via ORAL
  Filled 2022-04-23: qty 15

## 2022-04-23 MED ORDER — HYOSCYAMINE SULFATE 0.125 MG SL SUBL
0.1250 mg | SUBLINGUAL_TABLET | Freq: Once | SUBLINGUAL | Status: AC
  Administered 2022-04-23: 0.125 mg via ORAL

## 2022-04-23 MED ORDER — HYOSCYAMINE SULFATE 0.125 MG PO TABS
0.1250 mg | ORAL_TABLET | Freq: Once | ORAL | Status: DC
  Filled 2022-04-23: qty 1

## 2022-04-23 MED ORDER — AMLODIPINE BESYLATE 5 MG PO TABS: 5.0000 mg | ORAL_TABLET | Freq: Every day | ORAL | 11 refills | Status: DC | PRN

## 2022-04-23 MED ORDER — ALUM & MAG HYDROXIDE-SIMETH 200-200-20 MG/5ML PO SUSP
30.0000 mL | Freq: Once | ORAL | Status: AC
  Administered 2022-04-23: 30 mL via ORAL
  Filled 2022-04-23: qty 30

## 2022-04-23 MED ORDER — FAMOTIDINE 20 MG PO TABS: 20.0000 mg | ORAL_TABLET | Freq: Two times a day (BID) | ORAL | 3 refills | Status: DC

## 2022-04-23 MED ORDER — HYOSCYAMINE SULFATE 0.125 MG PO TABS: 0.1250 mg | ORAL_TABLET | ORAL | 0 refills | Status: DC | PRN

## 2022-04-23 NOTE — MAU Note (Addendum)
.  Lindsey Hunt is a 41 y.o. at 42w2dhere in MAU reporting: lower ABD pain that radiates up her belly and N/V since about 0315, pt states pain feels like "CTX". Pt denies VB, LOF, abnormal discharge, and recent intercourse.   Onset of complaint: 0315 Pain score: 10/10 Vitals:   04/23/22 0453  BP: 132/76  Pulse: 85  Resp: 18  Temp: 98.2 F (36.8 C)  SpO2: 100%      Lab orders placed from triage:  UA

## 2022-04-23 NOTE — MAU Provider Note (Addendum)
History     [id number]  Arrival date and time: 04/23/22 0441    Chief Complaint  Patient presents with   Abdominal Pain     HPI Lindsey Hunt is a 41 y.o. at 81w2dwho presents for abdominal pain.  Has had lower abdominal cramping intermittently for the last several weeks.  Current pain started this morning around 3 AM.  Reports mid epigastric pain that feels like contractions and radiates up her chest into her throat.  Vomited prior to coming to MAU but currently denies nausea.  She ate pasta and chicken at a cookout last night.  Currently denies lower abdominal pain.  Denies fever, diarrhea, constipation, or vaginal bleeding.  Has not treated symptoms.  OB History     Gravida  9   Para  6   Term  6   Preterm      AB  2   Living  6      SAB  1   IAB      Ectopic  1   Multiple      Live Births  6           Past Medical History:  Diagnosis Date   Anemia    Ectopic pregnancy     Past Surgical History:  Procedure Laterality Date   BUNIONECTOMY Left     History reviewed. No pertinent family history.  No Known Allergies  No current facility-administered medications on file prior to encounter.   Current Outpatient Medications on File Prior to Encounter  Medication Sig Dispense Refill   ferrous sulfate 325 (65 FE) MG tablet Take 325 mg by mouth daily as needed (low iron). (Patient not taking: Reported on 04/12/2022)       ROS Pertinent positives and negative per HPI, all others reviewed and negative  Physical Exam   BP (!) 110/55 (BP Location: Left Arm)   Pulse 87   Temp 98.2 F (36.8 C) (Oral)   Resp 19   Ht '5\' 6"'$  (1.676 m)   Wt 93.1 kg   LMP 03/03/2022 (Exact Date)   SpO2 99%   BMI 33.12 kg/m   Patient Vitals for the past 24 hrs:  BP Temp Temp src Pulse Resp SpO2 Height Weight  04/23/22 0737 (!) 110/55 98.2 F (36.8 C) Oral 87 19 99 % -- --  04/23/22 0453 132/76 98.2 F (36.8 C) Oral 85 18 100 % '5\' 6"'$  (1.676 m) 93.1 kg     Physical Exam Vitals and nursing note reviewed.  Constitutional:      General: She is not in acute distress.    Appearance: She is well-developed.  HENT:     Head: Normocephalic and atraumatic.  Pulmonary:     Effort: Pulmonary effort is normal. No respiratory distress.  Abdominal:     General: Abdomen is flat.     Palpations: Abdomen is soft.     Tenderness: There is no abdominal tenderness. There is no guarding or rebound. Negative signs include Murphy's sign.  Skin:    General: Skin is warm and dry.  Neurological:     Mental Status: She is alert.      Labs Results for orders placed or performed during the hospital encounter of 04/23/22 (from the past 24 hour(s))  Urinalysis, Routine w reflex microscopic     Status: Abnormal   Collection Time: 04/23/22  5:05 AM  Result Value Ref Range   Color, Urine YELLOW YELLOW   APPearance CLOUDY (A) CLEAR  Specific Gravity, Urine 1.026 1.005 - 1.030   pH 5.0 5.0 - 8.0   Glucose, UA NEGATIVE NEGATIVE mg/dL   Hgb urine dipstick NEGATIVE NEGATIVE   Bilirubin Urine NEGATIVE NEGATIVE   Ketones, ur NEGATIVE NEGATIVE mg/dL   Protein, ur 30 (A) NEGATIVE mg/dL   Nitrite NEGATIVE NEGATIVE   Leukocytes,Ua NEGATIVE NEGATIVE   RBC / HPF 0-5 0 - 5 RBC/hpf   WBC, UA 0-5 0 - 5 WBC/hpf   Bacteria, UA FEW (A) NONE SEEN   Squamous Epithelial / LPF 21-50 0 - 5   Mucus PRESENT   CBC     Status: Abnormal   Collection Time: 04/23/22  5:30 AM  Result Value Ref Range   WBC 8.2 4.0 - 10.5 K/uL   RBC 4.01 3.87 - 5.11 MIL/uL   Hemoglobin 11.1 (L) 12.0 - 15.0 g/dL   HCT 33.5 (L) 36.0 - 46.0 %   MCV 83.5 80.0 - 100.0 fL   MCH 27.7 26.0 - 34.0 pg   MCHC 33.1 30.0 - 36.0 g/dL   RDW 15.2 11.5 - 15.5 %   Platelets 387 150 - 400 K/uL   nRBC 0.0 0.0 - 0.2 %  Comprehensive metabolic panel     Status: Abnormal   Collection Time: 04/23/22  5:30 AM  Result Value Ref Range   Sodium 138 135 - 145 mmol/L   Potassium 3.4 (L) 3.5 - 5.1 mmol/L   Chloride  104 98 - 111 mmol/L   CO2 24 22 - 32 mmol/L   Glucose, Bld 107 (H) 70 - 99 mg/dL   BUN 6 6 - 20 mg/dL   Creatinine, Ser 0.91 0.44 - 1.00 mg/dL   Calcium 8.7 (L) 8.9 - 10.3 mg/dL   Total Protein 6.8 6.5 - 8.1 g/dL   Albumin 3.4 (L) 3.5 - 5.0 g/dL   AST 21 15 - 41 U/L   ALT 20 0 - 44 U/L   Alkaline Phosphatase 62 38 - 126 U/L   Total Bilirubin 0.7 0.3 - 1.2 mg/dL   GFR, Estimated >60 >60 mL/min   Anion gap 10 5 - 15  Lipase, blood     Status: None   Collection Time: 04/23/22  5:30 AM  Result Value Ref Range   Lipase 29 11 - 51 U/L  hCG, quantitative, pregnancy     Status: Abnormal   Collection Time: 04/23/22  5:30 AM  Result Value Ref Range   hCG, Beta Chain, Quant, S 24,643 (H) <5 mIU/mL    Imaging US OB Transvaginal  Result Date: 04/23/2022 CLINICAL DATA:  Abdominal pain and first-trimester pregnancy EXAM: OBSTETRIC <14 WK [place] AND TRANSVAGINAL OB US TECHNIQUE: Both transabdominal and transvaginal ultrasound examinations were performed for complete evaluation of the gestation as well as the maternal uterus, adnexal regions, and pelvic cul-de-sac. Transvaginal technique was performed to assess early pregnancy. COMPARISON:  04/10/2022 FINDINGS: Intrauterine gestational sac: Single Yolk sac:  Visualized. Embryo:  Visualized. Cardiac Activity: Visualized. Heart Rate: 105 bpm CRL:  6 mm   6 w   3 d                  [place] EDC: 12/15/2022 Subchorionic hemorrhage: Subchorionic hemorrhage measuring 21 x 6 mm. Maternal uterus/adnexae: Intramural fibroid with heterogeneous partially shadowing appearance, up to 3.7 cm. IMPRESSION: 1. Single living intrauterine pregnancy measuring 6 weeks 3 days. 2. Subchorionic hemorrhage measuring up to 21 x 6 mm. 3. Intramural fibroid measuring up to 3.7 cm. Electronically Signed   By:  Jorje Guild M.D.   On: 04/23/2022 07:11   US OB Comp Less 14 Wks  Result Date: 04/23/2022 CLINICAL DATA:  Abdominal pain and first-trimester pregnancy EXAM: OBSTETRIC <14 WK [place] AND  TRANSVAGINAL OB US TECHNIQUE: Both transabdominal and transvaginal ultrasound examinations were performed for complete evaluation of the gestation as well as the maternal uterus, adnexal regions, and pelvic cul-de-sac. Transvaginal technique was performed to assess early pregnancy. COMPARISON:  04/10/2022 FINDINGS: Intrauterine gestational sac: Single Yolk sac:  Visualized. Embryo:  Visualized. Cardiac Activity: Visualized. Heart Rate: 105 bpm CRL:  6 mm   6 w   3 d                  [place] EDC: 12/15/2022 Subchorionic hemorrhage: Subchorionic hemorrhage measuring 21 x 6 mm. Maternal uterus/adnexae: Intramural fibroid with heterogeneous partially shadowing appearance, up to 3.7 cm. IMPRESSION: 1. Single living intrauterine pregnancy measuring 6 weeks 3 days. 2. Subchorionic hemorrhage measuring up to 21 x 6 mm. 3. Intramural fibroid measuring up to 3.7 cm. Electronically Signed   By: Jorje Guild M.D.   On: 04/23/2022 07:11    MAU Course  Procedures Lab Orders         Urinalysis, Routine w reflex microscopic         CBC         Comprehensive metabolic panel         Lipase, blood         hCG, quantitative, pregnancy     Meds ordered this encounter  Medications   AND Linked Order Group    alum & mag hydroxide-simeth (MAALOX/MYLANTA) 200-200-20 MG/5ML suspension 30 mL    lidocaine (XYLOCAINE) 2 % viscous mouth solution 15 mL   DISCONTD: hyoscyamine (LEVSIN) tablet 0.125 mg   metoCLOPramide (REGLAN) tablet 10 mg   hyoscyamine (LEVSIN SL) SL tablet 0.125 mg   Imaging Orders         US OB Transvaginal         US OB Comp Less 14 Wks      MDM Ultrasound shows live IUP measuring [redacted]w[redacted]d EDD updated  Epigastric pain. Benign abdominal exam. Patient afebrile & normal labs. Given GI cocktail with no relief in symptoms. Reports decrease in symptom frequency after reglan & levsin but still rates pain 10/10.   Care turned over to Dr. SNehemiah Settle  LJorje Guild NP 04/23/2022 8:04 AM   Patient reexamined.   The patient states her pain is less frequent but still occurs on occasion.  Currently no palpable abdominal pain/chest pain.  This appears to be esophageal spasm and not cardiac related.  If this is esophageal spasm, Levsin should be helpful.  Will also give patient prescription for calcium channel blocker as additive therapy of Levsin is only partially helpful.  Assessment and Plan   1. [redacted] weeks gestation of pregnancy   2. Esophageal spasm    Discharge to home with Levsin and H2 blocker. Amlodipine as needed if Pepcid and an H2 blocker not effective. Information for GI given to patient Return for worsening pain.  JTruett Mainland DO 04/23/2022 8:53 AM

## 2022-04-23 NOTE — Discharge Instructions (Signed)
[facility]   [facility]             [address], [place], [place] [address] [phone]  [facility]                                                             [address], [address], [place], [place], [address] [phone]  [facility] [place]                                    [address], [address], , [place], [address] [phone]  [facility] [place] [address], [place], [place], [address] [phone]  [facility]                                 [address], [place], [place], [address] [phone]  [facility]                                    [address], , [place], [address] [phone]  [facility] [address], [address], [place], [place], [address]                              [facility] [address], [place], [place], [address] [phone]  [facility]         Phone: [phone]  [facility]      Phone: [phone]   [facility]      Phone: [phone]  [facility]         Phone: [phone]   [facility]    Phone: [phone]  [facility]      Phone: [phone]  [facility]     Phone: [phone]  [facility]        Phone: [phone]  [facility]      Phone: [phone]  

## 2022-05-01 ENCOUNTER — Other Ambulatory Visit: Payer: Medicaid Other

## 2022-05-01 ENCOUNTER — Telehealth: Payer: Self-pay | Admitting: Family Medicine

## 2022-05-01 ENCOUNTER — Encounter (HOSPITAL_COMMUNITY): Payer: Self-pay | Admitting: Obstetrics and Gynecology

## 2022-05-01 ENCOUNTER — Inpatient Hospital Stay (HOSPITAL_COMMUNITY)
Admission: AD | Admit: 2022-05-01 | Discharge: 2022-05-01 | Disposition: A | Discharge status: Home / Self Care | Payer: Medicaid Other | Attending: Obstetrics and Gynecology | Admitting: Obstetrics and Gynecology

## 2022-05-01 ENCOUNTER — Other Ambulatory Visit: Payer: Self-pay | Admitting: Lactation Services

## 2022-05-01 DIAGNOSIS — O3411 Maternal care for benign tumor of corpus uteri, first trimester: Secondary | ICD-10-CM

## 2022-05-01 DIAGNOSIS — O26859 Spotting complicating pregnancy, unspecified trimester: Secondary | ICD-10-CM | POA: Diagnosis not present

## 2022-05-01 DIAGNOSIS — O468X1 Other antepartum hemorrhage, first trimester: Secondary | ICD-10-CM

## 2022-05-01 DIAGNOSIS — N949 Unspecified condition associated with female genital organs and menstrual cycle: Secondary | ICD-10-CM | POA: Diagnosis not present

## 2022-05-01 DIAGNOSIS — Z3A01 Less than 8 weeks gestation of pregnancy: Secondary | ICD-10-CM | POA: Insufficient documentation

## 2022-05-01 DIAGNOSIS — R102 Pelvic and perineal pain: Secondary | ICD-10-CM | POA: Insufficient documentation

## 2022-05-01 DIAGNOSIS — O4691 Antepartum hemorrhage, unspecified, first trimester: Secondary | ICD-10-CM | POA: Diagnosis not present

## 2022-05-01 DIAGNOSIS — O26891 Other specified pregnancy related conditions, first trimester: Secondary | ICD-10-CM

## 2022-05-01 DIAGNOSIS — D259 Leiomyoma of uterus, unspecified: Secondary | ICD-10-CM | POA: Diagnosis not present

## 2022-05-01 DIAGNOSIS — O208 Other hemorrhage in early pregnancy: Secondary | ICD-10-CM | POA: Diagnosis present

## 2022-05-01 DIAGNOSIS — O2691 Pregnancy related conditions, unspecified, first trimester: Secondary | ICD-10-CM | POA: Diagnosis not present

## 2022-05-01 DIAGNOSIS — Z711 Person with feared health complaint in whom no diagnosis is made: Secondary | ICD-10-CM

## 2022-05-01 DIAGNOSIS — O418X10 Other specified disorders of amniotic fluid and membranes, first trimester, not applicable or unspecified: Secondary | ICD-10-CM

## 2022-05-01 LAB — URINALYSIS, ROUTINE W REFLEX MICROSCOPIC
Bilirubin Urine: NEGATIVE
Glucose, UA: NEGATIVE mg/dL
Ketones, ur: 5 mg/dL — AB
Nitrite: NEGATIVE
Protein, ur: 30 mg/dL — AB
Specific Gravity, Urine: 1.029 (ref 1.005–1.030)
pH: 5 (ref 5.0–8.0)

## 2022-05-01 MED ORDER — ACETAMINOPHEN 500 MG PO TABS
1000.0000 mg | ORAL_TABLET | Freq: Once | ORAL | Status: AC
  Administered 2022-05-01: 1000 mg via ORAL
  Filled 2022-05-01: qty 2

## 2022-05-01 NOTE — Progress Notes (Signed)
Pt informed that the ultrasound is considered a limited OB ultrasound and is not intended to be a complete ultrasound exam.  Patient also informed that the ultrasound is not being completed with the intent of assessing for fetal or placental anomalies or any pelvic abnormalities.  Explained that the purpose of today's ultrasound is to assess for  viability.  Patient acknowledges the purpose of the exam and the limitations of the study.    IUP, FHR 160 bpm   Jorje Guild, NP

## 2022-05-01 NOTE — MAU Note (Signed)
...  Lindsey Hunt is a 41 y.o. at 67w4dhere in MAU reporting: Vaginal spotting that began this past Sunday. She reports she only sees this spotting with wiping after she uses the restroom. Denies recent IC. She reports around 1100 this morning she started experiencing right lower abdominal pain that radiates "around her side" to her right lower back. Denies VB. Denies vaginal discharge, vaginal itching, and vaginal odors. Denies urinary frequency, strong smelling urine, or pain with urination.  Onset of complaint: Sunday Pain score:   7/10 right lower abdomen 7/10 right lower back  Lab orders placed from triage:  UA

## 2022-05-01 NOTE — MAU Note (Signed)
Patient signed physical copy of AVS due to E-signature malfunction in room. Placed in medical records bin.

## 2022-05-01 NOTE — Telephone Encounter (Signed)
Patient is not sure what her next step is, she was told she need another [place] but no one has called her.

## 2022-05-01 NOTE — Discharge Instructions (Signed)
Prenatal Care Providers           [facility] [place] [facility]  [place] [phone]  [facility] @ [place]  [phone]  [place] @ [facility]       [address] [phone]            [facility] [place] @ [place]     [phone]          [facility] [place] [facility]   [address] [phone]  [facility] [place] @ [place] [phone]     [facility] [place] @ [facility])  [place]   [phone]      ** [place]**    [facility]  Phone: [facility]  Phone: [facility] Phone: [phone]  [facility] Phone: [phone]  [facility] Phone: [phone]  [facility] Phone: [phone]  [facility]  Phone: [phone]

## 2022-05-01 NOTE — MAU Provider Note (Signed)
History     CSN: [id number]  Arrival date and time: 05/01/22 1658   Event Date/Time   First Provider Initiated Contact with Patient 05/01/22 1747      Chief Complaint  Patient presents with   Abdominal Pain   Back Pain   Vaginal Bleeding   Lindsey Hunt, a  41 y.o. [id number] at 88w5dpresents to MAU with complaints of vaginal Bleeding started last Saturday. She also reports pain that started around 11am today. Patient Describes light pink when wiping, but denies clots or bight red bleeding. Denies wearing a pad. She also denies other vaginal symptoms and urinary symptoms. Patient states pain is right sided, pelvic pain that worsens with movement. Especially with walking. Currently rates pain 7/10. Denies attempting relief. She denies back pain and problems with constipation and diarrhea.      OB History     Gravida  9   Para  6   Term  6   Preterm      AB  2   Living  6      SAB  1   IAB      Ectopic  1   Multiple      Live Births  6           Past Medical History:  Diagnosis Date   Anemia    Ectopic pregnancy     Past Surgical History:  Procedure Laterality Date   BUNIONECTOMY Left     History reviewed. No pertinent family history.  Social History   Tobacco Use   Smoking status: Former   Smokeless tobacco: Never  VScientific laboratory technicianUse: Never used  Substance Use Topics   Alcohol use: Not Currently   Drug use: No    Allergies: No Known Allergies  Medications Prior to Admission  Medication Sig Dispense Refill Last Dose   amLODipine (NORVASC) 5 MG tablet Take 1 tablet (5 mg total) by mouth daily as needed (esophogeal spasm). 30 tablet 11 Past Week   famotidine (PEPCID) 20 MG tablet Take 1 tablet (20 mg total) by mouth 2 (two) times daily. 60 tablet 3 Past Week   hyoscyamine (LEVSIN) 0.125 MG tablet TAKE 1 TABLET (0.125 MG TOTAL) BY MOUTH EVERY 4 (FOUR) HOURS AS NEEDED. 30 tablet 0 Past Week    Review of Systems   Constitutional:  Negative for chills, fatigue and fever.  Respiratory:  Negative for apnea, shortness of breath and wheezing.   Cardiovascular:  Negative for chest pain and palpitations.  Gastrointestinal:  Positive for abdominal pain. Negative for constipation, diarrhea, nausea and vomiting.  Genitourinary:  Positive for vaginal bleeding. Negative for difficulty urinating, dysuria, flank pain, pelvic pain, vaginal discharge and vaginal pain.  Musculoskeletal:  Negative for back pain.  Neurological:  Negative for seizures, weakness and headaches.  Psychiatric/Behavioral:  Negative for suicidal ideas. The patient is nervous/anxious.    Physical Exam   Blood pressure 119/60, pulse 68, temperature 98.2 F (36.8 C), temperature source Oral, resp. rate 17, height '5\' 6"'$  (1.676 m), weight 93.9 kg, last menstrual period 03/03/2022, SpO2 100 %, unknown if currently breastfeeding.  Physical Exam Vitals and nursing note reviewed. Exam conducted with a chaperone present.  Constitutional:      General: She is not in acute distress.    Appearance: Normal appearance.  HENT:     Head: Normocephalic.  Cardiovascular:     Rate and Rhythm: Normal rate.  Pulmonary:     Effort: Pulmonary effort is  normal.  Abdominal:     General: Bowel sounds are normal.     Tenderness: There is abdominal tenderness in the right lower quadrant. There is no right CVA tenderness or left CVA tenderness.     Comments: Pregnant   Genitourinary:    Vagina: Normal.     Cervix: Normal.     Comments: Small amount of brown blood/discharge noted on exam.  Musculoskeletal:     Cervical back: Normal range of motion.  Skin:    General: Skin is warm and dry.  Neurological:     Mental Status: She is alert and oriented to person, place, and time.  Psychiatric:        Mood and Affect: Mood normal.    Bedside ultrasound completed by Robyne Askew, NP.  Cardiac activity noted on [place].   MAU Course  Procedures Orders Placed  This Encounter  Procedures   Culture, OB Urine   Urinalysis, Routine w reflex microscopic   Discharge patient   Meds ordered this encounter  Medications   acetaminophen (TYLENOL) tablet 1,000 mg   Results for orders placed or performed during the hospital encounter of 05/01/22 (from the past 24 hour(s))  Urinalysis, Routine w reflex microscopic     Status: Abnormal   Collection Time: 05/01/22  5:53 PM  Result Value Ref Range   Color, Urine AMBER (A) YELLOW   APPearance CLOUDY (A) CLEAR   Specific Gravity, Urine 1.029 1.005 - 1.030   pH 5.0 5.0 - 8.0   Glucose, UA NEGATIVE NEGATIVE mg/dL   Hgb urine dipstick MODERATE (A) NEGATIVE   Bilirubin Urine NEGATIVE NEGATIVE   Ketones, ur 5 (A) NEGATIVE mg/dL   Protein, ur 30 (A) NEGATIVE mg/dL   Nitrite NEGATIVE NEGATIVE   Leukocytes,Ua TRACE (A) NEGATIVE   RBC / HPF 0-5 0 - 5 RBC/hpf   WBC, UA 0-5 0 - 5 WBC/hpf   Bacteria, UA FEW (A) NONE SEEN   Squamous Epithelial / LPF 6-10 0 - 5   Mucus PRESENT    Ca Oxalate Crys, UA PRESENT    [place] results from last visit on 04/10/22 : IMPRESSION: 1. Single living intrauterine pregnancy measuring 6 weeks 3 days. 2. Subchorionic hemorrhage measuring up to 21 x 6 mm. 3. Intramural fibroid measuring up to 3.7  cm.  MDM Previous Ultrasound results reviewed by me.  Patient noted to have a subchorionic hemorrhage and uterine fibroid.  Reviewed results with patient. Patient reassured by bedside imaging and results from previous US.  Pain improved with tylenol.  No overt bleeding noted on exam. UA reflexed to culture.   Assessment and Plan   1. Uterine fibroids affecting pregnancy in first trimester   2. Round ligament pain   3. Spotting in pregnancy   4. Subchorionic hematoma in first trimester, single or unspecified fetus   5. [redacted] weeks gestation of pregnancy   6. Physically well but worried    - Discussed a subchorionic hemorrhage with patient and spotting/ bleeding expectations at early  gestation. - Also discussed uterine fibroids in pregnancy. Pain expectations and normalcy in pregnancy discussed.  - Recommended PO Tylenol for pain at this time. Admin instructions discussed at bedside.  - Patient unsure of where she would like to receive prenatal care. List of local providers given.  - Early pregnancy precautions reviewed.  - Patient discharged home from MAU in stable condition and may return to MAU as needed.   Jacquiline Doe, MSN CNM  05/01/2022, 5:47 PM

## 2022-05-02 ENCOUNTER — Encounter: Payer: Self-pay | Admitting: General Practice

## 2022-05-02 LAB — CULTURE, OB URINE: Culture: <10000 — AB

## 2022-05-02 NOTE — Telephone Encounter (Signed)
Called patient stating I am trying to reach her to return her phone call. Stated I see where she went to the hospital yesterday evening and wanted to know if she got all her questions answered there or if there was anything I could do. Patient states they answered everything there. Patient states she called yesterday and was told she had no appts and asked if I saw anything. Told patient she did have an ultrasound appt yesterday morning at 10:00am that she missed. Patient asked when that appt was made and why didn't a reminder call go out. Told patient the appt was made 7/26 and that reminder calls usually go out 24-48 hours ahead of appt via mychart, email or text depending on how she is set up. Told patient it should be the same for ultrasound appts but I am not sure. Patient states she was told she didn't have any appts when she called yesterday afternoon but thought she did have something. Patient had no other questions/concerns at this time.

## 2022-06-20 ENCOUNTER — Other Ambulatory Visit: Payer: Self-pay

## 2022-06-20 ENCOUNTER — Inpatient Hospital Stay (HOSPITAL_COMMUNITY): Payer: Medicaid Other

## 2022-06-20 ENCOUNTER — Encounter (HOSPITAL_COMMUNITY): Payer: Self-pay | Admitting: Obstetrics and Gynecology

## 2022-06-20 ENCOUNTER — Inpatient Hospital Stay (HOSPITAL_COMMUNITY)
Admission: AD | Admit: 2022-06-20 | Discharge: 2022-06-20 | Disposition: A | Discharge status: Home / Self Care | Payer: Medicaid Other | Attending: Obstetrics and Gynecology | Admitting: Obstetrics and Gynecology

## 2022-06-20 DIAGNOSIS — O021 Missed abortion: Secondary | ICD-10-CM | POA: Insufficient documentation

## 2022-06-20 LAB — URINALYSIS, ROUTINE W REFLEX MICROSCOPIC
Bilirubin Urine: NEGATIVE
Glucose, UA: NEGATIVE mg/dL
Hgb urine dipstick: NEGATIVE
Ketones, ur: NEGATIVE mg/dL
Leukocytes,Ua: NEGATIVE
Nitrite: NEGATIVE
Protein, ur: NEGATIVE mg/dL
Specific Gravity, Urine: 1.029 (ref 1.005–1.030)
pH: 5 (ref 5.0–8.0)

## 2022-06-20 LAB — WET PREP, GENITAL
Sperm: NONE SEEN
Trich, Wet Prep: NONE SEEN
WBC, Wet Prep HPF POC: <10 (ref <10)
Yeast Wet Prep HPF POC: NONE SEEN

## 2022-06-20 NOTE — MAU Note (Signed)
.  Lindsey Hunt is a 41 y.o. at 5w5dhere in MAU reporting: lower abdominal pain starting yesterday rating 8/10, describing them as cramping. Also reports VB also starting yesterday around the same time as her pain. Notices blood when she wipes and in her urine, denies saturation of pad and clots. Denies DFM and LOF.   Onset of complaint: 06/19/2022 Pain score: 8/10 There were no vitals filed for this visit.   FHT: UTA Lab orders placed from triage:  UA

## 2022-06-20 NOTE — MAU Provider Note (Signed)
History     CSN: [id number]  Arrival date and time: 06/20/22 1702   None     Chief Complaint  Patient presents with   Abdominal Pain   Vaginal Bleeding   HPI Lynda Wanninger. Hattery is a 41 y.o. [id number] at 29w5dwho presents to MAU for abdominal pain and vaginal bleeding. Patient reports lower abdominal cramping and vaginal bleeding started last night. Cramping comes and goes. Currently rates 8/10. Nothing makes the pain better or worse. She has not taken anything to relieve pain. She reports the bleeding only occurs when she wipes or goes to the bathroom. She denies passing any blood clots or tissue. No itching, odor, recent intercourse/pelvic exams, or fever. She reports normal BM's with 1 episode of loose stools yesterday.   Patient has not established prenatal care at this time.   OB History     Gravida  9   Para  6   Term  6   Preterm  0   AB  2   Living  6      SAB  1   IAB  0   Ectopic  1   Multiple  0   Live Births  6           Past Medical History:  Diagnosis Date   Anemia    Ectopic pregnancy     Past Surgical History:  Procedure Laterality Date   BUNIONECTOMY Left     History reviewed. No pertinent family history.  Social History   Tobacco Use   Smoking status: Former   Smokeless tobacco: Never  VScientific laboratory technicianUse: Never used  Substance Use Topics   Alcohol use: Not Currently   Drug use: No    Allergies: No Known Allergies  No medications prior to admission.   Review of Systems  Constitutional: Negative.   Respiratory: Negative.    Cardiovascular: Negative.   Gastrointestinal:  Positive for abdominal pain (cramping).  Genitourinary:  Positive for vaginal bleeding. Negative for dysuria and frequency.  Neurological: Negative.    Physical Exam   Blood pressure 134/79, pulse 64, temperature 98.2 F (36.8 C), temperature source Oral, resp. rate 18, last menstrual period 03/03/2022, SpO2 100 %, unknown if currently  breastfeeding.  Physical Exam Vitals and nursing note reviewed. Exam conducted with a chaperone present.  Constitutional:      General: She is not in acute distress. Eyes:     Extraocular Movements: Extraocular movements intact.     Pupils: Pupils are equal, round, and reactive to light.  Cardiovascular:     Rate and Rhythm: Normal rate.  Pulmonary:     Effort: Pulmonary effort is normal.  Abdominal:     Palpations: Abdomen is soft.     Tenderness: There is no abdominal tenderness.  Genitourinary:    Comments: Normal external female genitalia, vaginal walls pink with rugae, no bleeding, small amount of white discharge, cervix visually closed without lesions/masses Musculoskeletal:        General: Normal range of motion.     Cervical back: Normal range of motion.  Skin:    General: Skin is warm and dry.  Neurological:     General: No focal deficit present.     Mental Status: She is alert and oriented to person, place, and time.  Psychiatric:        Mood and Affect: Affect is flat.        Speech: Speech normal.  Behavior: Behavior is withdrawn.        Thought Content: Thought content normal.        Judgment: Judgment normal.    US OB Comp Less 14 Wks  Result Date: 06/20/2022 CLINICAL DATA:  Vaginal bleeding. EXAM: OBSTETRIC <14 WK [place] AND TRANSVAGINAL OB US TECHNIQUE: Both transabdominal and transvaginal ultrasound examinations were performed for complete evaluation of the gestation as well as the maternal uterus, adnexal regions, and pelvic cul-de-sac. Transvaginal technique was performed to assess early pregnancy. COMPARISON:  April 23, 2022 FINDINGS: Intrauterine gestational sac: Single Yolk sac:  Visualized. Embryo:  Visualized. Cardiac Activity: Not Visualized. Heart Rate: N/A  bpm CRL:  45.1 mm   11 w   2 d                  [place] EDC: January 07, 2023 Subchorionic hemorrhage:  None visualized. Maternal uterus/adnexae: A 4.6 cm x 3.8 cm x 4.3 cm uterine fibroid is seen. The  right ovary is not clearly visualized. The left ovary is visualized and is normal in appearance. No pelvic free fluid is seen. IMPRESSION: Findings meet definitive criteria for failed pregnancy (crown-rump length measurement greater than or equal to 7 mm and no fetal heart beat). This follows SRU consensus guidelines: Diagnostic Criteria for Nonviable Pregnancy Early in the First Trimester. Alison Stalling J Med [phone]. Electronically Signed   By: Virgina Norfolk M.D.   On: 06/20/2022 19:33    MAU Course  Procedures  MDM UA Wet prep, GC/CT Ultrasound  UA and wet prep negative. GC/CT pending. Unable to doppler FHT's so formal ultrasound was ordered. Ultrasound shows MAB at 11 weeks. C/w Dr. Elonda Husky. Will schedule patient for outpatient D&C. I offered chaplain services to patient- patient declines  Assessment and Plan  Missed abortion  - Discharge home in stable condition - Outpatient D&C- will send message to have scheduled - Return precautions. Return to MAU as needed for new/worsening symptoms   Renee Harder, CNM 06/20/2022, 8:57 PM

## 2022-06-21 LAB — GC/CHLAMYDIA PROBE AMP (~~LOC~~) NOT AT ARMC
Chlamydia: NEGATIVE
Comment: NEGATIVE
Comment: NORMAL
Neisseria Gonorrhea: NEGATIVE

## 2022-06-22 ENCOUNTER — Ambulatory Visit: Payer: Self-pay | Admitting: *Deleted

## 2022-06-22 NOTE — Telephone Encounter (Signed)
Pt's sister called in concerned about her excessive drinking of alcohol after "losing her baby".    She is for surgery on Tues. To have the fetus removed but she has been drinking a lot of alcohol since finding out the baby did not make it.   This is very unlike her.   Denies being suicidal.  I advised her sister to take her to the ED for a physical and mental check up.   The excessive alcohol could cause the surgery on Tues. To be postponed.  I also gave her sister the information for the [facility] [address].   Sister thanked me for my help and will get her sister (pt) to either the ED or [facility].    Sister is also staying with the pt all weekend.    I let her know she could call back if she had further questions or concerns.

## 2022-06-22 NOTE — Telephone Encounter (Signed)
Reason for Disposition  Teen sounds very sick or weak to the triager    Sister called in for pt.  Answer Assessment - Initial Assessment Questions 1. REASON FOR CALL: "What is your main concern or question(s) today?"     Sister calling in for sister.   She is drinking a lot of alcohol.    She just lost her baby.  She is depressed.   She doesn't normally drink.    Her procedure is Tuesday to  have the baby removed.    I just don't know what to do.     2. SYMPTOMS: "What symptoms is your teen currently experiencing?" (e.g., none; confusion, trouble walking, blackout spells)     I told my sister it's not a good idea to be drinking.   My concern is what happens with her drinking this much.   3. MENTAL STATUS: "Does your teen know who he is, who you are and where he is?"     She hasn't said anything about being suicidal.    4. ONSET: "When did these symptoms start?"     Started drinking Thursday.    5. LAST DRINK: "When did your teen last have an alcoholic drink? What was it (beer, wine, hard liquor)? How much?"     Still drinking today.    I'm going to stay with her this weekend.   Her boyfriend drives a truck and he is planning on going with her to have the surgery Tuesday.  6. RECURRENT SYMPTOM: "Has your sister had the symptoms before?" If so, ask: "When was the last time? What happened that time?"     No   This is not like her at all.   She will have an occasional drink while out to dinner or something but never like this. 7. HOW OFTEN: "How many days per week does your sister use alcohol?"      She is not using hard liqueur but she is drinking several glasses a day. 8. OTHER DRUGS: "What other drug(s) is your sister using?"       None that I know of. 9. THERAPIST: "Does your teen have a counselor or therapist? Name?"     No 10. TEEN'S APPEARANCE: "How does your teen look? What is he doing right now?"       N/A  Protocols used: Alcohol Use or Problems-P-AH

## 2022-06-24 ENCOUNTER — Other Ambulatory Visit: Payer: Self-pay | Admitting: Obstetrics & Gynecology

## 2022-06-24 DIAGNOSIS — Z01818 Encounter for other preprocedural examination: Secondary | ICD-10-CM

## 2022-06-25 ENCOUNTER — Encounter (HOSPITAL_COMMUNITY): Payer: Self-pay | Admitting: Obstetrics & Gynecology

## 2022-06-25 NOTE — Progress Notes (Signed)
Lindsey Hunt denies chest pain or shortness of breath. Patient denies having any s/s of Covid in her household, also denies any known exposure to Covid. Lindsey Hunt does not have a PCP.  I instructed Lindsey Hunt to arrive  13, patient said , "oh, no, I spoke with the Dr. Parks Hunt is doing the surgery on Thursday night, he said to arrive at 0830."  I explained that arrive at 0830 may not  be too late to get her ready OR. Lindsey Hunt said that she, "has to drop her daughter off at school at 23, 0830 is the earliest I can get here."  I instructed patient to use valet parking.

## 2022-06-26 ENCOUNTER — Ambulatory Visit (HOSPITAL_COMMUNITY): Payer: Medicaid Other | Admitting: Certified Registered Nurse Anesthetist

## 2022-06-26 ENCOUNTER — Ambulatory Visit (HOSPITAL_COMMUNITY)
Admission: RE | Admit: 2022-06-26 | Discharge: 2022-06-26 | Disposition: A | Discharge status: Home / Self Care | Payer: Medicaid Other | Attending: Obstetrics & Gynecology | Admitting: Obstetrics & Gynecology

## 2022-06-26 ENCOUNTER — Ambulatory Visit (HOSPITAL_BASED_OUTPATIENT_CLINIC_OR_DEPARTMENT_OTHER): Payer: Medicaid Other | Admitting: Certified Registered Nurse Anesthetist

## 2022-06-26 ENCOUNTER — Encounter (HOSPITAL_COMMUNITY)
Admission: RE | Disposition: A | Discharge status: Home / Self Care | Payer: Self-pay | Source: Home / Self Care | Attending: Obstetrics & Gynecology

## 2022-06-26 ENCOUNTER — Other Ambulatory Visit: Payer: Self-pay

## 2022-06-26 ENCOUNTER — Encounter (HOSPITAL_COMMUNITY): Payer: Self-pay | Admitting: Obstetrics & Gynecology

## 2022-06-26 DIAGNOSIS — O021 Missed abortion: Secondary | ICD-10-CM | POA: Insufficient documentation

## 2022-06-26 DIAGNOSIS — O99214 Obesity complicating childbirth: Secondary | ICD-10-CM

## 2022-06-26 DIAGNOSIS — Z01818 Encounter for other preprocedural examination: Secondary | ICD-10-CM

## 2022-06-26 DIAGNOSIS — E669 Obesity, unspecified: Secondary | ICD-10-CM

## 2022-06-26 DIAGNOSIS — Z87891 Personal history of nicotine dependence: Secondary | ICD-10-CM | POA: Diagnosis not present

## 2022-06-26 HISTORY — PX: DILATION AND CURETTAGE OF UTERUS: SHX78

## 2022-06-26 HISTORY — DX: Bipolar disorder, unspecified: F31.9

## 2022-06-26 HISTORY — DX: Headache, unspecified: R51.9

## 2022-06-26 HISTORY — DX: Anxiety disorder, unspecified: F41.9

## 2022-06-26 HISTORY — DX: Depression, unspecified: F32.A

## 2022-06-26 LAB — CBC
HCT: 39.7 % (ref 36.0–46.0)
Hemoglobin: 12.3 g/dL (ref 12.0–15.0)
MCH: 27.8 pg (ref 26.0–34.0)
MCHC: 31 g/dL (ref 30.0–36.0)
MCV: 89.8 fL (ref 80.0–100.0)
Platelets: 392 10*3/uL (ref 150–400)
RBC: 4.42 MIL/uL (ref 3.87–5.11)
RDW: 13.7 % (ref 11.5–15.5)
WBC: 6 10*3/uL (ref 4.0–10.5)
nRBC: 0 % (ref 0.0–0.2)

## 2022-06-26 LAB — TYPE AND SCREEN
ABO/RH(D): O POS
Antibody Screen: NEGATIVE

## 2022-06-26 SURGERY — DILATION AND CURETTAGE
Anesthesia: Monitor Anesthesia Care

## 2022-06-26 MED ORDER — MISOPROSTOL 200 MCG PO TABS: 400.0000 ug | ORAL_TABLET | Freq: Three times a day (TID) | ORAL | 0 refills | Status: AC

## 2022-06-26 MED ORDER — OXYCODONE HCL 5 MG/5ML PO SOLN: 5.0000 mg | Freq: Once | ORAL | Status: DC | PRN

## 2022-06-26 MED ORDER — FENTANYL CITRATE (PF) 100 MCG/2ML IJ SOLN
INTRAMUSCULAR | Status: DC | PRN
  Administered 2022-06-26 (×3): 50 ug via INTRAVENOUS

## 2022-06-26 MED ORDER — ONDANSETRON HCL 4 MG/2ML IJ SOLN
INTRAMUSCULAR | Status: AC
  Filled 2022-06-26: qty 2

## 2022-06-26 MED ORDER — ONDANSETRON 8 MG PO TBDP: 8.0000 mg | ORAL_TABLET | Freq: Three times a day (TID) | ORAL | 0 refills | Status: DC | PRN

## 2022-06-26 MED ORDER — METHYLERGONOVINE MALEATE 0.2 MG/ML IJ SOLN
INTRAMUSCULAR | Status: DC | PRN
  Administered 2022-06-26: .2 mg via INTRAMUSCULAR

## 2022-06-26 MED ORDER — POVIDONE-IODINE 10 % EX SWAB: 2.0000 | Freq: Once | CUTANEOUS | Status: DC

## 2022-06-26 MED ORDER — CARBOPROST TROMETHAMINE 250 MCG/ML IM SOLN
INTRAMUSCULAR | Status: AC
  Filled 2022-06-26: qty 1

## 2022-06-26 MED ORDER — LIDOCAINE 2% (20 MG/ML) 5 ML SYRINGE
INTRAMUSCULAR | Status: AC
  Filled 2022-06-26: qty 5

## 2022-06-26 MED ORDER — PROPOFOL 500 MG/50ML IV EMUL
INTRAVENOUS | Status: DC | PRN
  Administered 2022-06-26: 150 ug/kg/min via INTRAVENOUS

## 2022-06-26 MED ORDER — OXYCODONE HCL 5 MG PO TABS: 5.0000 mg | ORAL_TABLET | Freq: Once | ORAL | Status: DC | PRN

## 2022-06-26 MED ORDER — ONDANSETRON HCL 4 MG/2ML IJ SOLN
INTRAMUSCULAR | Status: DC | PRN
  Administered 2022-06-26: 4 mg via INTRAVENOUS

## 2022-06-26 MED ORDER — IBUPROFEN 100 MG/5ML PO SUSP
200.0000 mg | Freq: Four times a day (QID) | ORAL | Status: DC | PRN
  Filled 2022-06-26: qty 20

## 2022-06-26 MED ORDER — ONDANSETRON HCL 4 MG/2ML IJ SOLN: 4.0000 mg | Freq: Once | INTRAMUSCULAR | Status: DC | PRN

## 2022-06-26 MED ORDER — KETOROLAC TROMETHAMINE 15 MG/ML IJ SOLN
30.0000 mg | Freq: Once | INTRAMUSCULAR | Status: AC
  Administered 2022-06-26: 30 mg via INTRAVENOUS
  Filled 2022-06-26: qty 2

## 2022-06-26 MED ORDER — LACTATED RINGERS IV SOLN: INTRAVENOUS | Status: DC

## 2022-06-26 MED ORDER — ACETAMINOPHEN 10 MG/ML IV SOLN: 1000.0000 mg | Freq: Once | INTRAVENOUS | Status: DC | PRN

## 2022-06-26 MED ORDER — KETOROLAC TROMETHAMINE 10 MG PO TABS: 10.0000 mg | ORAL_TABLET | Freq: Three times a day (TID) | ORAL | 0 refills | Status: DC | PRN

## 2022-06-26 MED ORDER — CEFAZOLIN SODIUM-DEXTROSE 2-4 GM/100ML-% IV SOLN
2.0000 g | INTRAVENOUS | Status: AC
  Administered 2022-06-26: 2 g via INTRAVENOUS
  Filled 2022-06-26: qty 100

## 2022-06-26 MED ORDER — MIDAZOLAM HCL 2 MG/2ML IJ SOLN
INTRAMUSCULAR | Status: AC
  Filled 2022-06-26: qty 2

## 2022-06-26 MED ORDER — CHLORHEXIDINE GLUCONATE 0.12 % MT SOLN
OROMUCOSAL | Status: AC
  Administered 2022-06-26: 15 mL via OROMUCOSAL
  Filled 2022-06-26: qty 15

## 2022-06-26 MED ORDER — MIDAZOLAM HCL 5 MG/5ML IJ SOLN
INTRAMUSCULAR | Status: DC | PRN
  Administered 2022-06-26: 2 mg via INTRAVENOUS

## 2022-06-26 MED ORDER — HYDROCODONE-ACETAMINOPHEN 5-325 MG PO TABS: 1.0000 | ORAL_TABLET | Freq: Four times a day (QID) | ORAL | 0 refills | Status: DC | PRN

## 2022-06-26 MED ORDER — FENTANYL CITRATE (PF) 250 MCG/5ML IJ SOLN
INTRAMUSCULAR | Status: AC
  Filled 2022-06-26: qty 5

## 2022-06-26 MED ORDER — LIDOCAINE HCL (CARDIAC) PF 100 MG/5ML IV SOSY
PREFILLED_SYRINGE | INTRAVENOUS | Status: DC | PRN
  Administered 2022-06-26: 60 mg via INTRAVENOUS

## 2022-06-26 MED ORDER — MEPERIDINE HCL 25 MG/ML IJ SOLN: 6.2500 mg | INTRAMUSCULAR | Status: DC | PRN

## 2022-06-26 MED ORDER — IBUPROFEN 200 MG PO TABS
200.0000 mg | ORAL_TABLET | Freq: Four times a day (QID) | ORAL | Status: DC | PRN
  Filled 2022-06-26: qty 2

## 2022-06-26 MED ORDER — DEXMEDETOMIDINE HCL IN NACL 80 MCG/20ML IV SOLN
INTRAVENOUS | Status: DC | PRN
  Administered 2022-06-26 (×4): 4 ug via BUCCAL

## 2022-06-26 MED ORDER — MISOPROSTOL 200 MCG PO TABS
ORAL_TABLET | ORAL | Status: AC
  Filled 2022-06-26: qty 1

## 2022-06-26 MED ORDER — METHYLERGONOVINE MALEATE 0.2 MG/ML IJ SOLN
INTRAMUSCULAR | Status: AC
  Filled 2022-06-26: qty 1

## 2022-06-26 MED ORDER — BUPIVACAINE HCL (PF) 0.5 % IJ SOLN
INTRAMUSCULAR | Status: AC
  Filled 2022-06-26: qty 30

## 2022-06-26 MED ORDER — PROPOFOL 10 MG/ML IV BOLUS
INTRAVENOUS | Status: DC | PRN
  Administered 2022-06-26: 30 mg via INTRAVENOUS
  Administered 2022-06-26: 10 mg via INTRAVENOUS

## 2022-06-26 MED ORDER — CHLORHEXIDINE GLUCONATE 0.12 % MT SOLN: 15.0000 mL | OROMUCOSAL | Status: AC

## 2022-06-26 MED ORDER — PROPOFOL 10 MG/ML IV BOLUS
INTRAVENOUS | Status: AC
  Filled 2022-06-26: qty 20

## 2022-06-26 MED ORDER — FENTANYL CITRATE (PF) 100 MCG/2ML IJ SOLN: 25.0000 ug | INTRAMUSCULAR | Status: DC | PRN

## 2022-06-26 SURGICAL SUPPLY — 15 items
CATH ROBINSON RED A/P 16FR (CATHETERS) ×1 IMPLANT
CNTNR URN SCR LID CUP LEK RST (MISCELLANEOUS) ×1 IMPLANT
CONT SPEC 4OZ STRL OR WHT (MISCELLANEOUS) ×1
GLOVE BIOGEL PI IND STRL 7.0 (GLOVE) ×1 IMPLANT
GLOVE BIOGEL PI IND STRL 8 (GLOVE) ×1 IMPLANT
GLOVE ECLIPSE 8.0 STRL XLNG CF (GLOVE) ×1 IMPLANT
GOWN STRL REUS W/ TWL LRG LVL3 (GOWN DISPOSABLE) ×2 IMPLANT
GOWN STRL REUS W/TWL LRG LVL3 (GOWN DISPOSABLE) ×2
KIT TURNOVER KIT B (KITS) ×1 IMPLANT
PACK VAGINAL MINOR WOMEN LF (CUSTOM PROCEDURE TRAY) ×1 IMPLANT
PAD OB MATERNITY 4.3X12.25 (PERSONAL CARE ITEMS) ×1 IMPLANT
SPIKE FLUID TRANSFER (MISCELLANEOUS) ×1 IMPLANT
TOWEL GREEN STERILE FF (TOWEL DISPOSABLE) ×2 IMPLANT
UNDERPAD 30X36 HEAVY ABSORB (UNDERPADS AND DIAPERS) ×1 IMPLANT
VACURETTE 10 RIGID CVD (CANNULA) IMPLANT

## 2022-06-26 NOTE — H&P (Signed)
Preoperative History and Physical  Lindsey Hunt is a 41 y.o. [id number] with Patient's last menstrual period was 03/03/2022 (exact date). admitted for a D&C for 11 week pregnancy loss.  Sonogram done last week reveals non viable 11 week loss, scheduled for D&C today See report below  PMH:    Past Medical History:  Diagnosis Date   Anemia    Anxiety    Bipolar disorder ([place])    Depression    Ectopic pregnancy    Headache     PSH:     Past Surgical History:  Procedure Laterality Date   BUNIONECTOMY Left     POb/GynH:      OB History     Gravida  9   Para  6   Term  6   Preterm  0   AB  2   Living  6      SAB  1   IAB  0   Ectopic  1   Multiple  0   Live Births  6           SH:   Social History   Tobacco Use   Smoking status: Former   Smokeless tobacco: Never   Tobacco comments:    06/25/22- "long time ago."  Vaping Use   Vaping Use: Never used  Substance Use Topics   Alcohol use: Yes    Comment: sometimes,"I do not count how many at a time". Patient 's sister called triage at [facility] to report that patient had been drinking alot since she lost the baby.   Drug use: No    FH:   History reviewed. No pertinent family history.   Allergies: No Known Allergies  Medications:       Current Facility-Administered Medications:    ceFAZolin (ANCEF) IVPB 2g/100 mL premix, 2 g, Intravenous, On Call to OR, Elonda Husky, Mertie Clause, MD   lactated ringers infusion, , Intravenous, Continuous, Hatchett, Franklin, MD, Last Rate: 10 mL/hr at 06/26/22 0922, New Bag at 06/26/22 0922   povidone-iodine 10 % swab 2 Application, 2 Application, Topical, Once, Florian Buff, MD  Review of Systems:   Review of Systems  Constitutional: Negative for fever, chills, weight loss, malaise/fatigue and diaphoresis.  HENT: Negative for hearing loss, ear pain, nosebleeds, congestion, sore throat, neck pain, tinnitus and ear discharge.   Eyes: Negative for blurred  vision, double vision, photophobia, pain, discharge and redness.  Respiratory: Negative for cough, hemoptysis, sputum production, shortness of breath, wheezing and stridor.   Cardiovascular: Negative for chest pain, palpitations, orthopnea, claudication, leg swelling and PND.  Gastrointestinal: Positive for abdominal pain. Negative for heartburn, nausea, vomiting, diarrhea, constipation, blood in stool and melena.  Genitourinary: Negative for dysuria, urgency, frequency, hematuria and flank pain.  Musculoskeletal: Negative for myalgias, back pain, joint pain and falls.  Skin: Negative for itching and rash.  Neurological: Negative for dizziness, tingling, tremors, sensory change, speech change, focal weakness, seizures, loss of consciousness, weakness and headaches.  Endo/Heme/Allergies: Negative for environmental allergies and polydipsia. Does not bruise/bleed easily.  Psychiatric/Behavioral: Negative for depression, suicidal ideas, hallucinations, memory loss and substance abuse. The patient is not nervous/anxious and does not have insomnia.      PHYSICAL EXAM:  Blood pressure 131/86, pulse 68, temperature 98.2 F (36.8 C), resp. rate 17, height '5\' 6"'$  (1.676 m), weight 90.7 kg, last menstrual period 03/03/2022, SpO2 99 %, unknown if currently breastfeeding.    Vitals reviewed. Constitutional: She is oriented to person, place, and  time. She appears well-developed and well-nourished.  HENT:  Head: Normocephalic and atraumatic.  Right Ear: External ear normal.  Left Ear: External ear normal.  Nose: Nose normal.  Mouth/Throat: Oropharynx is clear and moist.  Eyes: Conjunctivae and EOM are normal. Pupils are equal, round, and reactive to light. Right eye exhibits no discharge. Left eye exhibits no discharge. No scleral icterus.  Neck: Normal range of motion. Neck supple. No tracheal deviation present. No thyromegaly present.  Cardiovascular: Normal rate, regular rhythm, normal heart sounds  and intact distal pulses.  Exam reveals no gallop and no friction rub.   No murmur heard. Respiratory: Effort normal and breath sounds normal. No respiratory distress. She has no wheezes. She has no rales. She exhibits no tenderness.  GI: Soft. Bowel sounds are normal. She exhibits no distension and no mass. There is tenderness. There is no rebound and no guarding.  Genitourinary:       Vulva is normal without lesions Vagina is pink moist without discharge Cervix normal in appearance and pap is normal Uterus is 11 week size Adnexa is negative with normal sized ovaries by sonogram  Musculoskeletal: Normal range of motion. She exhibits no edema and no tenderness.  Neurological: She is alert and oriented to person, place, and time. She has normal reflexes. She displays normal reflexes. No cranial nerve deficit. She exhibits normal muscle tone. Coordination normal.  Skin: Skin is warm and dry. No rash noted. No erythema. No pallor.  Psychiatric: She has a normal mood and affect. Her behavior is normal. Judgment and thought content normal.    Labs: Results for orders placed or performed during the hospital encounter of 06/26/22 (from the past 336 hour(s))  CBC   Collection Time: 06/26/22  8:59 AM  Result Value Ref Range   WBC 6.0 4.0 - 10.5 K/uL   RBC 4.42 3.87 - 5.11 MIL/uL   Hemoglobin 12.3 12.0 - 15.0 g/dL   HCT 39.7 36.0 - 46.0 %   MCV 89.8 80.0 - 100.0 fL   MCH 27.8 26.0 - 34.0 pg   MCHC 31.0 30.0 - 36.0 g/dL   RDW 13.7 11.5 - 15.5 %   Platelets 392 150 - 400 K/uL   nRBC 0.0 0.0 - 0.2 %  Results for orders placed or performed during the hospital encounter of 06/20/22 (from the past 336 hour(s))  Urinalysis, Routine w reflex microscopic Urine, Clean Catch   Collection Time: 06/20/22  6:09 PM  Result Value Ref Range   Color, Urine YELLOW YELLOW   APPearance HAZY (A) CLEAR   Specific Gravity, Urine 1.029 1.005 - 1.030   pH 5.0 5.0 - 8.0   Glucose, UA NEGATIVE NEGATIVE mg/dL   Hgb  urine dipstick NEGATIVE NEGATIVE   Bilirubin Urine NEGATIVE NEGATIVE   Ketones, ur NEGATIVE NEGATIVE mg/dL   Protein, ur NEGATIVE NEGATIVE mg/dL   Nitrite NEGATIVE NEGATIVE   Leukocytes,Ua NEGATIVE NEGATIVE  GC/Chlamydia probe amp ([place])not at [facility]   Collection Time: 06/20/22  6:45 PM  Result Value Ref Range   Neisseria Gonorrhea Negative    Chlamydia Negative    Comment Normal Reference Ranger Chlamydia - Negative    Comment      Normal Reference Range Neisseria Gonorrhea - Negative  Wet prep, genital   Collection Time: 06/20/22  6:51 PM  Result Value Ref Range   Yeast Wet Prep HPF POC NONE SEEN NONE SEEN   Trich, Wet Prep NONE SEEN NONE SEEN   Clue Cells Wet Prep HPF  POC PRESENT (A) NONE SEEN   WBC, Wet Prep HPF POC <10 <10   Sperm NONE SEEN     EKG: Orders placed or performed during the hospital encounter of 01/26/21   ED EKG   ED EKG   ED EKG   ED EKG   EKG 12-Lead   EKG 12-Lead    Imaging Studies: US OB Comp Less 14 Wks  Result Date: 06/20/2022 CLINICAL DATA:  Vaginal bleeding. EXAM: OBSTETRIC <14 WK [place] AND TRANSVAGINAL OB US TECHNIQUE: Both transabdominal and transvaginal ultrasound examinations were performed for complete evaluation of the gestation as well as the maternal uterus, adnexal regions, and pelvic cul-de-sac. Transvaginal technique was performed to assess early pregnancy. COMPARISON:  April 23, 2022 FINDINGS: Intrauterine gestational sac: Single Yolk sac:  Visualized. Embryo:  Visualized. Cardiac Activity: Not Visualized. Heart Rate: N/A  bpm CRL:  45.1 mm   11 w   2 d                  [place] EDC: January 07, 2023 Subchorionic hemorrhage:  None visualized. Maternal uterus/adnexae: A 4.6 cm x 3.8 cm x 4.3 cm uterine fibroid is seen. The right ovary is not clearly visualized. The left ovary is visualized and is normal in appearance. No pelvic free fluid is seen. IMPRESSION: Findings meet definitive criteria for failed pregnancy (crown-rump length measurement  greater than or equal to 7 mm and no fetal heart beat). This follows SRU consensus guidelines: Diagnostic Criteria for Nonviable Pregnancy Early in the First Trimester. Alison Stalling J Med [phone]. Electronically Signed   By: Virgina Norfolk M.D.   On: 06/20/2022 19:33      Assessment: 11 week pregnancy loss   Plan: Cervical dilation and uterine curettage, suction/sharp  Mertie Clause Lindsey Hunt 06/26/2022 10:01 AM

## 2022-06-26 NOTE — Anesthesia Preprocedure Evaluation (Signed)
Anesthesia Evaluation  Patient identified by MRN, date of birth, ID band Patient awake    Reviewed: Allergy & Precautions, NPO status , Patient's Chart, lab work & pertinent test results  Airway Mallampati: I       Dental  (+) Poor Dentition, Missing,    Pulmonary neg pulmonary ROS, former smoker,    Pulmonary exam normal        Cardiovascular Normal cardiovascular exam     Neuro/Psych PSYCHIATRIC DISORDERS    GI/Hepatic negative GI ROS, Neg liver ROS,   Endo/Other  negative endocrine ROS  Renal/GU negative Renal ROS  negative genitourinary   Musculoskeletal negative musculoskeletal ROS (+)   Abdominal (+) + obese,   Peds  Hematology   Anesthesia Other Findings   Reproductive/Obstetrics                             Anesthesia Physical Anesthesia Plan  ASA: 2  Anesthesia Plan: MAC   Post-op Pain Management: Minimal or no pain anticipated   Induction:   PONV Risk Score and Plan: 3 and Ondansetron, Dexamethasone and Midazolam  Airway Management Planned: Natural Airway and Simple Face Mask  Additional Equipment: None  Intra-op Plan:   Post-operative Plan:   Informed Consent: I have reviewed the patients History and Physical, chart, labs and discussed the procedure including the risks, benefits and alternatives for the proposed anesthesia with the patient or authorized representative who has indicated his/her understanding and acceptance.     Dental advisory given  Plan Discussed with: CRNA  Anesthesia Plan Comments:         Anesthesia Quick Evaluation

## 2022-06-26 NOTE — Transfer of Care (Signed)
Immediate Anesthesia Transfer of Care Note  Patient: Lindsey Hunt  Procedure(s) Performed: SUCTION DILATATION AND CURETTAGE  Patient Location: PACU  Anesthesia Type:MAC  Level of Consciousness: drowsy and patient cooperative  Airway & Oxygen Therapy: Patient Spontanous Breathing  Post-op Assessment: Report given to RN and Post -op Vital signs reviewed and stable  Post vital signs: Reviewed and stable  Last Vitals:  Vitals Value Taken Time  BP 103/63 06/26/22 1105  Temp    Pulse 75 06/26/22 1108  Resp 16 06/26/22 1108  SpO2 93 % 06/26/22 1108  Vitals shown include unvalidated device data.  Last Pain:  Vitals:   06/26/22 0909  PainSc: 0-No pain      Patients Stated Pain Goal: 0 (04/59/97 7414)  Complications: No notable events documented.

## 2022-06-26 NOTE — Op Note (Signed)
Preoperative diagnosis:  11 week pregnancy loss with fetal retention  Postoperative diagnosis:  Same as above  Procedure:  Cervical dilation with suction and sharp uterine curettage  Surgeon:  Florian Buff  Anesthesia:  Laryngeal mask airway  Findings:  The patient was known to have a 11 week pregnancy loss based on sonogram findings from last week.  In the meantime she had experienced no bleeding or cramping, some spotting only.  Here for planned uterine evacuation  Description of operation:  The patient was taken to the operating room and placed in the supine position.  She underwent MAC anesthesia.  The patient was placed in the dorsal lithotomy position.  The vagina was prepped and draped in the usual sterile fashion.  A Graves speculum was placed.  The anterior cervix was grasped with a single-tooth tenaculum.  The cervix was dilated serially with Hegar dilators.  A #10 curved suction curette was placed in the uterus.  The suction pressure was placed at 55-60 and several passes were made.  All of the intrauterine contents were removed.  The sharp curette was used x1 to feel uterine crie in all areas.  The patient was given Methergine 0.2 mg IM x1.  There was good hemostasis.  The patient was given ancef 2 grams IV preoperatively.  The patient was given Toradol 30 mg IV preoperatively.  Estimated blood loss for the procedure was 200 cc.  The patient was awakened from anesthesia taken to the recovery room in good stable condition.  All counts were correct x3.  Florian Buff, MD 06/26/2022 11:20 AM

## 2022-06-26 NOTE — Anesthesia Postprocedure Evaluation (Signed)
Anesthesia Post Note  Patient: Lindsey Hunt  Procedure(s) Performed: SUCTION DILATATION AND CURETTAGE     Patient location during evaluation: PACU Anesthesia Type: MAC Level of consciousness: awake Pain management: pain level controlled Vital Signs Assessment: post-procedure vital signs reviewed and stable Respiratory status: spontaneous breathing Cardiovascular status: stable Postop Assessment: no apparent nausea or vomiting Anesthetic complications: no   No notable events documented.  Last Vitals:  Vitals:   06/26/22 1145 06/26/22 1200  BP: 122/74 122/74  Pulse: (!) 49 64  Resp: 13 14  Temp:    SpO2: 96% 96%    Last Pain:  Vitals:   06/26/22 1200  PainSc: 0-No pain                 John F Jaydn Fincher Jr

## 2022-06-27 ENCOUNTER — Encounter (HOSPITAL_COMMUNITY): Payer: Self-pay | Admitting: Obstetrics & Gynecology

## 2022-06-27 LAB — SURGICAL PATHOLOGY

## 2022-06-28 ENCOUNTER — Ambulatory Visit: Payer: Self-pay

## 2022-06-28 NOTE — Telephone Encounter (Signed)
  Chief Complaint: Post surgery vaginal bleeding Symptoms: Pt having clots  Frequency:  Pertinent Negatives: Patient denies  Disposition: '[]'$ ED /'[]'$ Urgent Care (no appt availability in office) / '[]'$ Appointment(In office/virtual)/ '[]'$  [facility]/ '[]'$ Home Care/ '[]'$ Refused Recommended Disposition /'[]'$ [facility]/ '[x]'$  Follow-up with PCP Additional Notes: Pt had a d& c following fetal loss. Pt reports bleeding and clots. Pt will call Gyn office who performed surgery for guidance. PT will call back if needed.  Reason for Disposition  Nursing judgment  Answer Assessment - Initial Assessment Questions 1. REASON FOR CALL or QUESTION: "What is your reason for calling today?" or "How can I best help you?" or "What question do you have that I can help answer?"     Pt is having post operative vaginal bleeding that pt feels is excessive.  Protocols used: Information Only Call - No Triage-A-AH, No Guideline or Reference Available-A-AH

## 2022-07-12 ENCOUNTER — Inpatient Hospital Stay (HOSPITAL_COMMUNITY): Payer: Medicaid Other

## 2022-07-12 ENCOUNTER — Inpatient Hospital Stay (HOSPITAL_COMMUNITY): Payer: Medicaid Other | Admitting: Certified Registered Nurse Anesthetist

## 2022-07-12 ENCOUNTER — Other Ambulatory Visit: Payer: Self-pay

## 2022-07-12 ENCOUNTER — Inpatient Hospital Stay (HOSPITAL_COMMUNITY)
Admission: AD | Admit: 2022-07-12 | Discharge: 2022-07-12 | Disposition: A | Discharge status: Home / Self Care | Payer: Medicaid Other | Attending: Family Medicine | Admitting: Family Medicine

## 2022-07-12 ENCOUNTER — Encounter (HOSPITAL_COMMUNITY): Payer: Self-pay

## 2022-07-12 ENCOUNTER — Inpatient Hospital Stay (EMERGENCY_DEPARTMENT_HOSPITAL): Payer: Medicaid Other | Admitting: Certified Registered Nurse Anesthetist

## 2022-07-12 ENCOUNTER — Encounter (HOSPITAL_COMMUNITY)
Admission: AD | Disposition: A | Discharge status: Home / Self Care | Payer: Self-pay | Source: Home / Self Care | Attending: Family Medicine

## 2022-07-12 DIAGNOSIS — Z87891 Personal history of nicotine dependence: Secondary | ICD-10-CM | POA: Insufficient documentation

## 2022-07-12 DIAGNOSIS — O071 Delayed or excessive hemorrhage following failed attempted termination of pregnancy: Secondary | ICD-10-CM | POA: Diagnosis not present

## 2022-07-12 DIAGNOSIS — F319 Bipolar disorder, unspecified: Secondary | ICD-10-CM | POA: Insufficient documentation

## 2022-07-12 DIAGNOSIS — F418 Other specified anxiety disorders: Secondary | ICD-10-CM | POA: Insufficient documentation

## 2022-07-12 DIAGNOSIS — Z0289 Encounter for other administrative examinations: Secondary | ICD-10-CM

## 2022-07-12 DIAGNOSIS — O021 Missed abortion: Secondary | ICD-10-CM

## 2022-07-12 DIAGNOSIS — E669 Obesity, unspecified: Secondary | ICD-10-CM | POA: Diagnosis not present

## 2022-07-12 DIAGNOSIS — O034 Incomplete spontaneous abortion without complication: Secondary | ICD-10-CM

## 2022-07-12 HISTORY — PX: DILATION AND EVACUATION: SHX1459

## 2022-07-12 HISTORY — DX: Incomplete spontaneous abortion without complication: O03.4

## 2022-07-12 LAB — CBC WITH DIFFERENTIAL/PLATELET
Abs Immature Granulocytes: 0.02 10*3/uL (ref 0.00–0.07)
Basophils Absolute: 0 10*3/uL (ref 0.0–0.1)
Basophils Relative: 1 %
Eosinophils Absolute: 0.3 10*3/uL (ref 0.0–0.5)
Eosinophils Relative: 5 %
HCT: 34.7 % — ABNORMAL LOW (ref 36.0–46.0)
Hemoglobin: 10.6 g/dL — ABNORMAL LOW (ref 12.0–15.0)
Immature Granulocytes: 0 %
Lymphocytes Relative: 37 %
Lymphs Abs: 2.3 10*3/uL (ref 0.7–4.0)
MCH: 27.4 pg (ref 26.0–34.0)
MCHC: 30.5 g/dL (ref 30.0–36.0)
MCV: 89.7 fL (ref 80.0–100.0)
Monocytes Absolute: 0.5 10*3/uL (ref 0.1–1.0)
Monocytes Relative: 7 %
Neutro Abs: 3.1 10*3/uL (ref 1.7–7.7)
Neutrophils Relative %: 50 %
Platelets: 497 10*3/uL — ABNORMAL HIGH (ref 150–400)
RBC: 3.87 MIL/uL (ref 3.87–5.11)
RDW: 13.6 % (ref 11.5–15.5)
WBC: 6.2 10*3/uL (ref 4.0–10.5)
nRBC: 0 % (ref 0.0–0.2)

## 2022-07-12 SURGERY — DILATION AND EVACUATION, UTERUS
Anesthesia: General

## 2022-07-12 MED ORDER — ONDANSETRON HCL 4 MG/2ML IJ SOLN
INTRAMUSCULAR | Status: DC | PRN
  Administered 2022-07-12: 4 mg via INTRAVENOUS

## 2022-07-12 MED ORDER — SODIUM CHLORIDE 0.9 % IV SOLN
100.0000 mg | INTRAVENOUS | Status: AC
  Administered 2022-07-12: 200 mg via INTRAVENOUS
  Filled 2022-07-12: qty 100

## 2022-07-12 MED ORDER — BUPIVACAINE HCL (PF) 0.25 % IJ SOLN
INTRAMUSCULAR | Status: AC
  Filled 2022-07-12: qty 30

## 2022-07-12 MED ORDER — 0.9 % SODIUM CHLORIDE (POUR BTL) OPTIME
TOPICAL | Status: DC | PRN
  Administered 2022-07-12: 1000 mL

## 2022-07-12 MED ORDER — FENTANYL CITRATE (PF) 250 MCG/5ML IJ SOLN
INTRAMUSCULAR | Status: DC | PRN
  Administered 2022-07-12: 100 ug via INTRAVENOUS

## 2022-07-12 MED ORDER — FENTANYL CITRATE (PF) 100 MCG/2ML IJ SOLN: 25.0000 ug | INTRAMUSCULAR | Status: DC | PRN

## 2022-07-12 MED ORDER — POVIDONE-IODINE 10 % EX SWAB: 2.0000 | Freq: Once | CUTANEOUS | Status: DC

## 2022-07-12 MED ORDER — OXYCODONE HCL 5 MG/5ML PO SOLN: 5.0000 mg | Freq: Once | ORAL | Status: DC | PRN

## 2022-07-12 MED ORDER — DOXYCYCLINE HYCLATE 100 MG PO CAPS: 100.0000 mg | ORAL_CAPSULE | Freq: Two times a day (BID) | ORAL | 0 refills | Status: AC

## 2022-07-12 MED ORDER — LACTATED RINGERS IV SOLN: INTRAVENOUS | Status: DC

## 2022-07-12 MED ORDER — MIDAZOLAM HCL 2 MG/2ML IJ SOLN
INTRAMUSCULAR | Status: AC
  Filled 2022-07-12: qty 2

## 2022-07-12 MED ORDER — ACETAMINOPHEN 160 MG/5ML PO SOLN: 1000.0000 mg | Freq: Once | ORAL | Status: DC | PRN

## 2022-07-12 MED ORDER — SODIUM CHLORIDE 0.9 % IV SOLN
100.0000 mg | INTRAVENOUS | Status: DC
  Filled 2022-07-12: qty 100

## 2022-07-12 MED ORDER — KETOROLAC TROMETHAMINE 30 MG/ML IJ SOLN
INTRAMUSCULAR | Status: DC | PRN
  Administered 2022-07-12: 30 mg via INTRAVENOUS

## 2022-07-12 MED ORDER — KETOROLAC TROMETHAMINE 10 MG PO TABS: 10.0000 mg | ORAL_TABLET | Freq: Three times a day (TID) | ORAL | 0 refills | Status: DC | PRN

## 2022-07-12 MED ORDER — KETOROLAC TROMETHAMINE 60 MG/2ML IM SOLN: 60.0000 mg | Freq: Once | INTRAMUSCULAR | Status: DC

## 2022-07-12 MED ORDER — ACETAMINOPHEN 10 MG/ML IV SOLN
INTRAVENOUS | Status: DC | PRN
  Administered 2022-07-12: 1000 mg via INTRAVENOUS

## 2022-07-12 MED ORDER — ACETAMINOPHEN 500 MG PO TABS: 1000.0000 mg | ORAL_TABLET | Freq: Once | ORAL | Status: DC | PRN

## 2022-07-12 MED ORDER — MIDAZOLAM HCL 2 MG/2ML IJ SOLN
INTRAMUSCULAR | Status: DC | PRN
  Administered 2022-07-12: 2 mg via INTRAVENOUS

## 2022-07-12 MED ORDER — DEXAMETHASONE SODIUM PHOSPHATE 10 MG/ML IJ SOLN
INTRAMUSCULAR | Status: DC | PRN
  Administered 2022-07-12: 5 mg via INTRAVENOUS

## 2022-07-12 MED ORDER — OXYCODONE HCL 5 MG PO TABS: 5.0000 mg | ORAL_TABLET | Freq: Four times a day (QID) | ORAL | 0 refills | Status: AC | PRN

## 2022-07-12 MED ORDER — OXYCODONE HCL 5 MG PO TABS: 5.0000 mg | ORAL_TABLET | Freq: Once | ORAL | Status: DC | PRN

## 2022-07-12 MED ORDER — LACTATED RINGERS IV SOLN: INTRAVENOUS | Status: DC | PRN

## 2022-07-12 MED ORDER — PROPOFOL 10 MG/ML IV BOLUS
INTRAVENOUS | Status: DC | PRN
  Administered 2022-07-12: 200 mg via INTRAVENOUS

## 2022-07-12 MED ORDER — BUPIVACAINE HCL (PF) 0.25 % IJ SOLN
INTRAMUSCULAR | Status: DC | PRN
  Administered 2022-07-12: 30 mL

## 2022-07-12 MED ORDER — FENTANYL CITRATE (PF) 250 MCG/5ML IJ SOLN
INTRAMUSCULAR | Status: AC
  Filled 2022-07-12: qty 5

## 2022-07-12 MED ORDER — SUCCINYLCHOLINE CHLORIDE 200 MG/10ML IV SOSY
PREFILLED_SYRINGE | INTRAVENOUS | Status: DC | PRN
  Administered 2022-07-12: 120 mg via INTRAVENOUS

## 2022-07-12 MED ORDER — ACETAMINOPHEN 10 MG/ML IV SOLN: 1000.0000 mg | Freq: Once | INTRAVENOUS | Status: DC | PRN

## 2022-07-12 MED ORDER — PROPOFOL 10 MG/ML IV BOLUS
INTRAVENOUS | Status: AC
  Filled 2022-07-12: qty 20

## 2022-07-12 MED ORDER — ACETAMINOPHEN 10 MG/ML IV SOLN
INTRAVENOUS | Status: AC
  Filled 2022-07-12: qty 100

## 2022-07-12 SURGICAL SUPPLY — 20 items
CATH ROBINSON RED A/P 16FR (CATHETERS) ×1 IMPLANT
FILTER UTR ASPR ASSEMBLY (MISCELLANEOUS) ×1 IMPLANT
GLOVE BIOGEL PI IND STRL 7.0 (GLOVE) ×2 IMPLANT
GLOVE ECLIPSE 7.0 STRL STRAW (GLOVE) ×2 IMPLANT
GOWN STRL REUS W/ TWL LRG LVL3 (GOWN DISPOSABLE) ×2 IMPLANT
GOWN STRL REUS W/TWL LRG LVL3 (GOWN DISPOSABLE) ×2
HOSE CONNECTING 18IN BERKELEY (TUBING) ×1 IMPLANT
KIT BERKELEY 1ST TRI 3/8 NO TR (MISCELLANEOUS) ×1 IMPLANT
KIT BERKELEY 1ST TRIMESTER 3/8 (MISCELLANEOUS) ×1 IMPLANT
NS IRRIG 1000ML POUR BTL (IV SOLUTION) ×1 IMPLANT
PACK VAGINAL MINOR WOMEN LF (CUSTOM PROCEDURE TRAY) ×1 IMPLANT
PAD OB MATERNITY 4.3X12.25 (PERSONAL CARE ITEMS) ×1 IMPLANT
SET BERKELEY SUCTION TUBING (SUCTIONS) ×1 IMPLANT
SPIKE FLUID TRANSFER (MISCELLANEOUS) ×1 IMPLANT
TOWEL GREEN STERILE FF (TOWEL DISPOSABLE) ×2 IMPLANT
UNDERPAD 30X36 HEAVY ABSORB (UNDERPADS AND DIAPERS) ×1 IMPLANT
VACURETTE 10 RIGID CVD (CANNULA) IMPLANT
VACURETTE 7MM CVD STRL WRAP (CANNULA) IMPLANT
VACURETTE 8 RIGID CVD (CANNULA) IMPLANT
VACURETTE 9 RIGID CVD (CANNULA) IMPLANT

## 2022-07-12 NOTE — MAU Provider Note (Signed)
History     CSN: [id number]  Arrival date and time: 07/12/22 1659   None     Chief Complaint  Patient presents with   Abdominal Pain   Vaginal Bleeding   HPI  Lindsey Hunt is a 41 y.o. female Status post D/E for an 11 week fetal loss. She reports heavy vaginal bleeding that went through a pad last night. She reports intense abdominal cramping that comes and goes. She had taken tylenol which is not helping the pain. The last time she took tylenol was yesterday. No fever. Reports strong odor to her discharge/blood.   Last eat or drink was 1300.   OB History     Gravida  9   Para  6   Term  6   Preterm  0   AB  2   Living  6      SAB  1   IAB  0   Ectopic  1   Multiple  0   Live Births  6           Past Medical History:  Diagnosis Date   Anemia    Anxiety    Bipolar disorder ([place])    Depression    Ectopic pregnancy    Headache     Past Surgical History:  Procedure Laterality Date   BUNIONECTOMY Left    DILATION AND CURETTAGE OF UTERUS N/A 06/26/2022   Procedure: SUCTION DILATATION AND CURETTAGE;  Surgeon: Florian Buff, MD;  Location: [facility];  Service: Gynecology;  Laterality: N/A;    No family history on file.  Social History   Tobacco Use   Smoking status: Former   Smokeless tobacco: Never   Tobacco comments:    06/25/22- "long time ago."  Vaping Use   Vaping Use: Never used  Substance Use Topics   Alcohol use: Yes    Comment: sometimes,"I do not count how many at a time". Patient 's sister called triage at [facility] to report that patient had been drinking alot since she lost the baby.   Drug use: No    Allergies: No Known Allergies  Medications Prior to Admission  Medication Sig Dispense Refill Last Dose   amLODipine (NORVASC) 5 MG tablet Take 1 tablet (5 mg total) by mouth daily as needed (esophogeal spasm). (Patient not taking: Reported on 06/22/2022) 30 tablet 11    famotidine (PEPCID) 20 MG tablet Take 1  tablet (20 mg total) by mouth 2 (two) times daily. (Patient not taking: Reported on 06/22/2022) 60 tablet 3    HYDROcodone-acetaminophen (NORCO/VICODIN) 5-325 MG tablet Take 1 tablet by mouth every 6 (six) hours as needed. 10 tablet 0    hyoscyamine (LEVSIN) 0.125 MG tablet TAKE 1 TABLET (0.125 MG TOTAL) BY MOUTH EVERY 4 (FOUR) HOURS AS NEEDED. (Patient not taking: Reported on 06/22/2022) 30 tablet 0    ketorolac (TORADOL) 10 MG tablet Take 1 tablet (10 mg total) by mouth every 8 (eight) hours as needed. 15 tablet 0    ondansetron (ZOFRAN-ODT) 8 MG disintegrating tablet Take 1 tablet (8 mg total) by mouth every 8 (eight) hours as needed for nausea or vomiting. 8 tablet 0    Results for orders placed or performed during the hospital encounter of 07/12/22 (from the past 48 hour(s))  CBC with Differential/Platelet     Status: Abnormal   Collection Time: 07/12/22  5:47 PM  Result Value Ref Range   WBC 6.2 4.0 - 10.5 K/uL   RBC 3.87  3.87 - 5.11 MIL/uL   Hemoglobin 10.6 (L) 12.0 - 15.0 g/dL   HCT 34.7 (L) 36.0 - 46.0 %   MCV 89.7 80.0 - 100.0 fL   MCH 27.4 26.0 - 34.0 pg   MCHC 30.5 30.0 - 36.0 g/dL   RDW 13.6 11.5 - 15.5 %   Platelets 497 (H) 150 - 400 K/uL   nRBC 0.0 0.0 - 0.2 %   Neutrophils Relative % 50 %   Neutro Abs 3.1 1.7 - 7.7 K/uL   Lymphocytes Relative 37 %   Lymphs Abs 2.3 0.7 - 4.0 K/uL   Monocytes Relative 7 %   Monocytes Absolute 0.5 0.1 - 1.0 K/uL   Eosinophils Relative 5 %   Eosinophils Absolute 0.3 0.0 - 0.5 K/uL   Basophils Relative 1 %   Basophils Absolute 0.0 0.0 - 0.1 K/uL   Immature Granulocytes 0 %   Abs Immature Granulocytes 0.02 0.00 - 0.07 K/uL    Comment: Performed at [facility] [address] [place], [place] [address]    Review of Systems  Constitutional:  Negative for fever.  Gastrointestinal:  Positive for abdominal pain.  Genitourinary:  Positive for vaginal bleeding.   Physical Exam   Blood pressure 128/88, pulse 68, temperature 98.5 F  (36.9 C), temperature source Oral, resp. rate 16, height '5\' 6"'$  (1.676 m), weight 91.7 kg, SpO2 100 %, unknown if currently breastfeeding.  Physical Exam Constitutional:      General: She is not in acute distress.    Appearance: She is well-developed. She is not ill-appearing, toxic-appearing or diaphoretic.  Skin:    General: Skin is warm.  Neurological:     Mental Status: She is alert and oriented to person, place, and time.    MAU Course  Procedures  MDM  Patient was seen in the family room d/t acuity of unit.  [place] ordered with CBC Toradol ordered for pain Reviewed [place] results with Dr. Kennon Rounds who plans to come down to speak with patient directly Patient is NPO  Assessment and Plan   A:  1. Retained products of conception after miscarriage      P:  Dr. Kennon Rounds to resume care of the patient.  Noni Saupe I, NP 07/12/2022 8:30 PM

## 2022-07-12 NOTE — H&P (Signed)
Lindsey Hunt is an 41 y.o. [phone] female.   Chief Complaint: vaginal bleeding HPI: pt. Is s/p D and E for 11 wk missed AB on 10/10. Has on-going bleeding and abdominal pain and foul odor. In MAU noted to have drop in hgb to 10 from 12 and retained POC on u/s.  Past Medical History:  Diagnosis Date   Anemia    Anxiety    Bipolar disorder ([place])    Depression    Ectopic pregnancy    Headache     Past Surgical History:  Procedure Laterality Date   BUNIONECTOMY Left    DILATION AND CURETTAGE OF UTERUS N/A 06/26/2022   Procedure: SUCTION DILATATION AND CURETTAGE;  Surgeon: Florian Buff, MD;  Location: [facility];  Service: Gynecology;  Laterality: N/A;    No family history on file. Social History:  reports that she has quit smoking. She has never used smokeless tobacco. She reports current alcohol use. She reports that she does not use drugs.  Allergies: No Known Allergies  Medications Prior to Admission  Medication Sig Dispense Refill   amLODipine (NORVASC) 5 MG tablet Take 1 tablet (5 mg total) by mouth daily as needed (esophogeal spasm). (Patient not taking: Reported on 06/22/2022) 30 tablet 11   famotidine (PEPCID) 20 MG tablet Take 1 tablet (20 mg total) by mouth 2 (two) times daily. (Patient not taking: Reported on 06/22/2022) 60 tablet 3   HYDROcodone-acetaminophen (NORCO/VICODIN) 5-325 MG tablet Take 1 tablet by mouth every 6 (six) hours as needed. 10 tablet 0   hyoscyamine (LEVSIN) 0.125 MG tablet TAKE 1 TABLET (0.125 MG TOTAL) BY MOUTH EVERY 4 (FOUR) HOURS AS NEEDED. (Patient not taking: Reported on 06/22/2022) 30 tablet 0   ketorolac (TORADOL) 10 MG tablet Take 1 tablet (10 mg total) by mouth every 8 (eight) hours as needed. 15 tablet 0   ondansetron (ZOFRAN-ODT) 8 MG disintegrating tablet Take 1 tablet (8 mg total) by mouth every 8 (eight) hours as needed for nausea or vomiting. 8 tablet 0    A comprehensive review of systems was negative.  Blood pressure 128/88, pulse  68, temperature 98.5 F (36.9 C), temperature source Oral, resp. rate 16, height '5\' 6"'$  (1.676 m), weight 91.7 kg, SpO2 100 %, unknown if currently breastfeeding. General appearance: alert, cooperative, and appears stated age Head: Normocephalic, without obvious abnormality, atraumatic Neck: supple, symmetrical, trachea midline Lungs:  normal effort Heart: regular rate and rhythm Abdomen: soft, non-tender; bowel sounds normal; no masses,  no organomegaly Extremities: extremities normal, atraumatic, no cyanosis or edema Skin: Skin color, texture, turgor normal. No rashes or lesions Neurologic: Grossly normal   Results for orders placed or performed during the hospital encounter of 07/12/22 (from the past 24 hour(s))  CBC with Differential/Platelet     Status: Abnormal   Collection Time: 07/12/22  5:47 PM  Result Value Ref Range   WBC 6.2 4.0 - 10.5 K/uL   RBC 3.87 3.87 - 5.11 MIL/uL   Hemoglobin 10.6 (L) 12.0 - 15.0 g/dL   HCT 34.7 (L) 36.0 - 46.0 %   MCV 89.7 80.0 - 100.0 fL   MCH 27.4 26.0 - 34.0 pg   MCHC 30.5 30.0 - 36.0 g/dL   RDW 13.6 11.5 - 15.5 %   Platelets 497 (H) 150 - 400 K/uL   nRBC 0.0 0.0 - 0.2 %   Neutrophils Relative % 50 %   Neutro Abs 3.1 1.7 - 7.7 K/uL   Lymphocytes Relative 37 %  Lymphs Abs 2.3 0.7 - 4.0 K/uL   Monocytes Relative 7 %   Monocytes Absolute 0.5 0.1 - 1.0 K/uL   Eosinophils Relative 5 %   Eosinophils Absolute 0.3 0.0 - 0.5 K/uL   Basophils Relative 1 %   Basophils Absolute 0.0 0.0 - 0.1 K/uL   Immature Granulocytes 0 %   Abs Immature Granulocytes 0.02 0.00 - 0.07 K/uL   US PELVIC COMPLETE WITH TRANSVAGINAL  Result Date: 07/12/2022 CLINICAL DATA:  D and C procedure 06/26/2022, increasing pain and vaginal bleeding today EXAM: TRANSABDOMINAL AND TRANSVAGINAL ULTRASOUND OF PELVIS TECHNIQUE: Both transabdominal and transvaginal ultrasound examinations of the pelvis were performed. Transabdominal technique was performed for global imaging of the  pelvis including uterus, ovaries, adnexal regions, and pelvic cul-de-sac. It was necessary to proceed with endovaginal exam following the transabdominal exam to visualize the endometrium and adnexal structures. COMPARISON:  06/20/2022 FINDINGS: Uterus Measurements: 11.4 x 7.2 x 8.9 cm = volume: 288 mL. The intramural fibroid seen previously is again identified measuring up to 5 cm in size. No other uterine masses. Endometrium Thickness: 2.6 cm. The endometrium is heterogeneous and thickened, with increased vascularity within the solid portions of the heterogeneous endometrium. Findings are consistent with retained products of conception after D and C procedure for failed pregnancy. Right ovary Not visualized. Left ovary Not visualized. Other findings No free fluid. IMPRESSION: 1. Thickened, hypervascular, and heterogeneous material within the endometrium compatible with retained products of conception in a patient with a prior D and C procedure for failed pregnancy. Electronically Signed   By: Randa Ngo M.D.   On: 07/12/2022 18:27   US OB Comp Less 14 Wks  Result Date: 06/20/2022 CLINICAL DATA:  Vaginal bleeding. EXAM: OBSTETRIC <14 WK [place] AND TRANSVAGINAL OB US TECHNIQUE: Both transabdominal and transvaginal ultrasound examinations were performed for complete evaluation of the gestation as well as the maternal uterus, adnexal regions, and pelvic cul-de-sac. Transvaginal technique was performed to assess early pregnancy. COMPARISON:  April 23, 2022 FINDINGS: Intrauterine gestational sac: Single Yolk sac:  Visualized. Embryo:  Visualized. Cardiac Activity: Not Visualized. Heart Rate: N/A  bpm CRL:  45.1 mm   11 w   2 d                  [place] EDC: January 07, 2023 Subchorionic hemorrhage:  None visualized. Maternal uterus/adnexae: A 4.6 cm x 3.8 cm x 4.3 cm uterine fibroid is seen. The right ovary is not clearly visualized. The left ovary is visualized and is normal in appearance. No pelvic free fluid is seen.  IMPRESSION: Findings meet definitive criteria for failed pregnancy (crown-rump length measurement greater than or equal to 7 mm and no fetal heart beat). This follows SRU consensus guidelines: Diagnostic Criteria for Nonviable Pregnancy Early in the First Trimester. Alison Stalling J Med [phone]. Electronically Signed   By: Virgina Norfolk M.D.   On: 06/20/2022 19:33    Assessment/Plan Retained POC  For Suction D & E Risks include but are not limited to bleeding, infection, injury to surrounding structures, including bowel, bladder and ureters, blood clots, and death.  Likelihood of success is high.    Lindsey Hunt 07/12/2022, 7:42 PM

## 2022-07-12 NOTE — Anesthesia Procedure Notes (Signed)
Procedure Name: Intubation Date/Time: 07/12/2022 9:11 PM  Performed by: Reece Agar, CRNAPre-anesthesia Checklist: Patient identified, Emergency Drugs available, Suction available and Patient being monitored Patient Re-evaluated:Patient Re-evaluated prior to induction Oxygen Delivery Method: Circle System Utilized Preoxygenation: Pre-oxygenation with 100% oxygen Induction Type: IV induction, Rapid sequence and Cricoid Pressure applied Laryngoscope Size: Mac and 3 Grade View: Grade I Tube type: Oral Tube size: 7.0 mm Number of attempts: 1 Airway Equipment and Method: Stylet Placement Confirmation: ETT inserted through vocal cords under direct vision, positive ETCO2 and breath sounds checked- equal and bilateral Secured at: 21 cm Tube secured with: Tape Dental Injury: Teeth and Oropharynx as per pre-operative assessment

## 2022-07-12 NOTE — Anesthesia Postprocedure Evaluation (Signed)
Anesthesia Post Note  Patient: ZORINA MALLIN  Procedure(s) Performed: DILATATION AND EVACUATION     Patient location during evaluation: PACU Anesthesia Type: General Level of consciousness: awake and alert Pain management: pain level controlled Vital Signs Assessment: post-procedure vital signs reviewed and stable Respiratory status: spontaneous breathing, nonlabored ventilation and respiratory function stable Cardiovascular status: blood pressure returned to baseline and stable Postop Assessment: no apparent nausea or vomiting Anesthetic complications: no Comments: Small hemorrhagic petechia to right of uvula with uvular swelling. Patient complains of a sore throat and of feeling uvular tissue with attempts to swallow. Reassured. Toradol and Decadron given intra-op. Patient instructed to call should symptoms worsen or report to the ED for difficulty breathing.    No notable events documented.  Last Vitals:  Vitals:   07/12/22 2215 07/12/22 2230  BP: (!) 148/93 (!) 134/98  Pulse: 66 62  Resp: 17 19  Temp:    SpO2: 98% 98%    Last Pain:  Vitals:   07/12/22 2215  TempSrc:   PainSc: 5                  Cydnie Deason

## 2022-07-12 NOTE — Transfer of Care (Signed)
Immediate Anesthesia Transfer of Care Note  Patient: Lindsey Hunt  Procedure(s) Performed: DILATATION AND EVACUATION  Patient Location: PACU  Anesthesia Type:General  Level of Consciousness: awake and alert   Airway & Oxygen Therapy: Patient Spontanous Breathing and Patient connected to face mask oxygen  Post-op Assessment: Report given to RN and Post -op Vital signs reviewed and stable  Post vital signs: Reviewed and stable  Last Vitals:  Vitals Value Taken Time  BP 148/93 07/12/22 2148  Temp    Pulse 93 07/12/22 2158  Resp 26 07/12/22 2158  SpO2 99 % 07/12/22 2158  Vitals shown include unvalidated device data.  Last Pain:  Vitals:   07/12/22 1714  TempSrc: Oral  PainSc:          Complications: No notable events documented.

## 2022-07-12 NOTE — MAU Note (Signed)
Lindsey Hunt is a 41 y.o. here in MAU reporting: had D&C on 10/10. States bleeding stopped 2 days ago and then last night it came back with an odor. No clots. Ongoing abdominal pain, pain is intermittent and seems to be stronger. Tylenol is not helping with the pain.   Onset of complaint: ongoing  Pain score: 10/10  Vitals:   07/12/22 1714  BP: 128/88  Pulse: 68  Resp: 16  Temp: 98.5 F (36.9 C)  SpO2: 100%     Lab orders placed from triage: none

## 2022-07-12 NOTE — Op Note (Signed)
Lindsey Hunt  PROCEDURE DATE: 07/12/2022  PREOPERATIVE DIAGNOSIS: Retained POC after D and E for 11 week loss  POSTOPERATIVE DIAGNOSIS: The same.  PROCEDURE:    Suction Dilation and Evacuation.  SURGEON:  Donnamae Jude  ANESTHESIA: Oleta Mouse, MD  INDICATIONS: 41 y.o. [phone] with retained POC after previous D 7 c for 11 week missed AB, needing surgical completion.  Risks of surgery were discussed with the patient including but not limited to: bleeding which may require transfusion; infection which may require antibiotics; injury to uterus or surrounding organs;need for additional procedures including laparotomy or laparoscopy; possibility of intrauterine scarring which may impair future fertility; and other postoperative/anesthesia complications. Written informed consent was obtained.    FINDINGS:  A 10 week size anteverted uterus, moderate amounts of products of conception, specimen sent to pathology.  ANESTHESIA:    Monitored intravenous sedation, paracervical block.  ESTIMATED BLOOD LOSS:  350 cc  SPECIMENS:  Uterine contents sent to pathology  COMPLICATIONS:  None immediate.  PROCEDURE DETAILS:  The patient received intravenous antibiotics while in the preoperative area.  She was then taken to the operating room where general anesthesia was administered and was found to be adequate.  After an adequate timeout was performed, she was placed in the dorsal lithotomy position and examined; then prepped and draped in the sterile manner.   Her bladder was catheterized for an unmeasured amount of clear, yellow urine. A vaginal speculum was then placed in the patient's vagina and a single tooth tenaculum was applied to the anterior lip of the cervix.  A paracervical block using 1% Lidocaine with Epinephrine was administered. The cervix was gently dilated to accommodate a 10 mm suction curette that was gently advanced to the uterine fundus.  The suction device was then activated  and curette slowly rotated to clear the uterus of products of conception. This was repeated several times. A sharp curettage was then performed to confirm complete emptying of the uterus. There was an area anteriorly and to patient's left that did not have a gritty texture, and so repeated sharp and suction curettage performed until a gritty texture was noted throughout. There was minimal bleeding noted and the tenaculum removed with good hemostasis noted. Bimanual massage done and confirmed a firm uterus.  All insturmnent, needle and lap counts were correct x 2.The patient tolerated the procedure well.  The patient was taken to the recovery area in stable condition.  Donnamae Jude 07/12/2022 9:32 PM

## 2022-07-12 NOTE — Anesthesia Preprocedure Evaluation (Signed)
Anesthesia Evaluation  Patient identified by MRN, date of birth, ID band Patient awake    Reviewed: Allergy & Precautions, NPO status , Patient's Chart, lab work & pertinent test results  History of Anesthesia Complications Negative for: history of anesthetic complications  Airway Mallampati: I       Dental  (+) Poor Dentition, Missing,    Pulmonary neg pulmonary ROS, former smoker,    Pulmonary exam normal        Cardiovascular negative cardio ROS Normal cardiovascular exam     Neuro/Psych  Headaches, PSYCHIATRIC DISORDERS Anxiety Depression Bipolar Disorder    GI/Hepatic negative GI ROS, Neg liver ROS,   Endo/Other  negative endocrine ROS  Renal/GU negative Renal ROS     Musculoskeletal negative musculoskeletal ROS (+)   Abdominal (+) + obese,   Peds  Hematology  (+) Blood dyscrasia, anemia , Lab Results      Component                Value               Date                      WBC                      6.2                 07/12/2022                HGB                      10.6 (L)            07/12/2022                HCT                      34.7 (L)            07/12/2022                MCV                      89.7                07/12/2022                PLT                      497 (H)             07/12/2022              Anesthesia Other Findings   Reproductive/Obstetrics Retained POC s/p 10/10 D/E                             Anesthesia Physical Anesthesia Plan  ASA: 2  Anesthesia Plan: General   Post-op Pain Management: Ofirmev IV (intra-op)* and Toradol IV (intra-op)*   Induction: Intravenous  PONV Risk Score and Plan: Ondansetron, Dexamethasone and Midazolam  Airway Management Planned: Oral ETT  Additional Equipment:   Intra-op Plan:   Post-operative Plan: Extubation in OR  Informed Consent:   Plan Discussed with:   Anesthesia Plan Comments:          Anesthesia Quick Evaluation

## 2022-07-13 ENCOUNTER — Encounter (HOSPITAL_COMMUNITY): Payer: Self-pay | Admitting: Family Medicine

## 2022-07-16 LAB — SURGICAL PATHOLOGY

## 2022-07-26 ENCOUNTER — Inpatient Hospital Stay (HOSPITAL_COMMUNITY): Payer: Medicaid Other

## 2022-07-26 ENCOUNTER — Inpatient Hospital Stay (HOSPITAL_COMMUNITY)
Admission: AD | Admit: 2022-07-26 | Discharge: 2022-07-26 | Disposition: A | Discharge status: Home / Self Care | Payer: Medicaid Other | Attending: Obstetrics and Gynecology | Admitting: Obstetrics and Gynecology

## 2022-07-26 ENCOUNTER — Encounter (HOSPITAL_COMMUNITY): Payer: Self-pay | Admitting: Obstetrics and Gynecology

## 2022-07-26 ENCOUNTER — Other Ambulatory Visit: Payer: Self-pay

## 2022-07-26 DIAGNOSIS — R102 Pelvic and perineal pain: Secondary | ICD-10-CM | POA: Insufficient documentation

## 2022-07-26 DIAGNOSIS — N853 Subinvolution of uterus: Secondary | ICD-10-CM | POA: Diagnosis not present

## 2022-07-26 DIAGNOSIS — Z9889 Other specified postprocedural states: Secondary | ICD-10-CM | POA: Diagnosis not present

## 2022-07-26 DIAGNOSIS — N939 Abnormal uterine and vaginal bleeding, unspecified: Secondary | ICD-10-CM | POA: Diagnosis not present

## 2022-07-26 DIAGNOSIS — N8500 Endometrial hyperplasia, unspecified: Secondary | ICD-10-CM | POA: Diagnosis not present

## 2022-07-26 DIAGNOSIS — Z8759 Personal history of other complications of pregnancy, childbirth and the puerperium: Secondary | ICD-10-CM | POA: Diagnosis not present

## 2022-07-26 DIAGNOSIS — R9389 Abnormal findings on diagnostic imaging of other specified body structures: Secondary | ICD-10-CM | POA: Diagnosis not present

## 2022-07-26 DIAGNOSIS — R197 Diarrhea, unspecified: Secondary | ICD-10-CM | POA: Insufficient documentation

## 2022-07-26 DIAGNOSIS — N852 Hypertrophy of uterus: Secondary | ICD-10-CM | POA: Diagnosis not present

## 2022-07-26 DIAGNOSIS — N83201 Unspecified ovarian cyst, right side: Secondary | ICD-10-CM | POA: Diagnosis not present

## 2022-07-26 LAB — CBC
HCT: 32.8 % — ABNORMAL LOW (ref 36.0–46.0)
Hemoglobin: 9.9 g/dL — ABNORMAL LOW (ref 12.0–15.0)
MCH: 27 pg (ref 26.0–34.0)
MCHC: 30.2 g/dL (ref 30.0–36.0)
MCV: 89.6 fL (ref 80.0–100.0)
Platelets: 499 10*3/uL — ABNORMAL HIGH (ref 150–400)
RBC: 3.66 MIL/uL — ABNORMAL LOW (ref 3.87–5.11)
RDW: 14.1 % (ref 11.5–15.5)
WBC: 6.6 10*3/uL (ref 4.0–10.5)
nRBC: 0 % (ref 0.0–0.2)

## 2022-07-26 MED ORDER — OXYCODONE HCL 5 MG PO TABS
10.0000 mg | ORAL_TABLET | Freq: Once | ORAL | Status: AC
  Administered 2022-07-26: 10 mg via ORAL
  Filled 2022-07-26: qty 2

## 2022-07-26 MED ORDER — OXYCODONE HCL 5 MG PO TABS: 5.0000 mg | ORAL_TABLET | ORAL | 0 refills | Status: DC | PRN

## 2022-07-26 NOTE — MAU Provider Note (Signed)
History     CSN: [id number]  Arrival date and time: 07/26/22 1200   None     Chief Complaint  Patient presents with   Vaginal Bleeding   Abdominal Pain   Lindsey Hunt is a 41 yo [id number] who presents with CC of abdominal pain 2 weeks after repeat D&E for retained products.   Since second D&E, has had lingering pain. It is better than before, and with the medicine she isn't feeling the pain. But she is having 8-9/10 pain when medication wears off. Pain was mild this week, mostly middle right of abdomen and left side of her back. After the hour is up, still feels pain with Tylenol, although does take the edge off. Takes 2 x 500 mg pills. Pain feels like contractions. When she goes to the bathroom has severe cramping. Takes 30-35 min in bathroom. Has not had clots. First week was seeing "little small bloody things" on pad. Still has those small brown bits of tissue. Friday, the blood was red until Monday. Monday the blood was brown, Tuesday went red, yesterday brown but a lot heavier. Always there when she wipes. Yesterday changed three pads with foul odor. They weren't soaked, but had a substantial amount covering the pad.   No discharge. No dysuria. No fevers. Feels weak and tired. No chills. Last night "felt weird" like she couldn't breathe, and has pain on inspiration (in throat and middle of chest), throat still hurts from being intubated, drinking hot tea and gargling salt water helped. Has had some chest pain for three days last week. No changes or pain in legs. No lightheadedness or dizziness. No vision changes. Did have a few episodes of diarrhea and loose stools. No N/V.   Didn't know what toradol was for, so wasn't taking it. Though it was making her sick, because it hurt her stomach.  Did have some oxycodone for 3 days, but ran out.     OB History     Gravida  9   Para  6   Term  6   Preterm  0   AB  3   Living  6      SAB  2   IAB  0   Ectopic  1    Multiple  0   Live Births  6           Past Medical History:  Diagnosis Date   Anemia    Anxiety    Bipolar disorder ([facility])    Depression    Ectopic pregnancy    Headache     Past Surgical History:  Procedure Laterality Date   BUNIONECTOMY Left    DILATION AND CURETTAGE OF UTERUS N/A 06/26/2022   Procedure: SUCTION DILATATION AND CURETTAGE;  Surgeon: Florian Buff, MD;  Location: [facility];  Service: Gynecology;  Laterality: N/A;   DILATION AND EVACUATION N/A 07/12/2022   Procedure: DILATATION AND EVACUATION;  Surgeon: Donnamae Jude, MD;  Location: [facility];  Service: Gynecology;  Laterality: N/A;    History reviewed. No pertinent family history.  Social History   Tobacco Use   Smoking status: Former   Smokeless tobacco: Never   Tobacco comments:    06/25/22- "long time ago."  Vaping Use   Vaping Use: Never used  Substance Use Topics   Alcohol use: Yes    Comment: sometimes,"I do not count how many at a time". Patient 's sister called triage at [facility] to report that patient had been drinking  alot since she lost the baby.   Drug use: No    Allergies: No Known Allergies  Medications Prior to Admission  Medication Sig Dispense Refill Last Dose   ketorolac (TORADOL) 10 MG tablet Take 1 tablet (10 mg total) by mouth every 8 (eight) hours as needed. 15 tablet 0 Past Week   ondansetron (ZOFRAN-ODT) 8 MG disintegrating tablet Take 1 tablet (8 mg total) by mouth every 8 (eight) hours as needed for nausea or vomiting. 8 tablet 0 Past Week   amLODipine (NORVASC) 5 MG tablet Take 1 tablet (5 mg total) by mouth daily as needed (esophogeal spasm). (Patient not taking: Reported on 06/22/2022) 30 tablet 11    famotidine (PEPCID) 20 MG tablet Take 1 tablet (20 mg total) by mouth 2 (two) times daily. (Patient not taking: Reported on 06/22/2022) 60 tablet 3    hyoscyamine (LEVSIN) 0.125 MG tablet TAKE 1 TABLET (0.125 MG TOTAL) BY MOUTH EVERY 4 (FOUR) HOURS AS NEEDED. (Patient not  taking: Reported on 06/22/2022) 30 tablet 0     Review of Systems  Constitutional:  Positive for fatigue. Negative for chills and fever.  HENT:  Positive for sore throat. Negative for rhinorrhea.   Respiratory:  Negative for cough and shortness of breath.   Cardiovascular:  Positive for chest pain. Negative for leg swelling.       Central, with deep breath.  Gastrointestinal:  Positive for abdominal pain and diarrhea. Negative for blood in stool, nausea and vomiting.  Genitourinary:  Positive for vaginal bleeding. Negative for difficulty urinating and vaginal discharge.  Musculoskeletal:  Positive for back pain.  Neurological:  Negative for dizziness, syncope, light-headedness and headaches.   Physical Exam   Blood pressure 136/89, pulse 67, temperature 98.4 F (36.9 C), temperature source Oral, resp. rate 18, height '5\' 6"'$  (1.676 m), weight 90.6 kg, SpO2 99 %, unknown if currently breastfeeding.  Physical Exam Vitals and nursing note reviewed.  Constitutional:      General: She is not in acute distress.    Appearance: She is well-developed. She is not ill-appearing, toxic-appearing or diaphoretic.  HENT:     Head: Normocephalic and atraumatic.  Eyes:     General: No scleral icterus.    Extraocular Movements: Extraocular movements intact.  Cardiovascular:     Rate and Rhythm: Normal rate and regular rhythm.  Pulmonary:     Effort: Pulmonary effort is normal.     Breath sounds: Normal breath sounds.  Abdominal:     General: Abdomen is flat. Bowel sounds are normal. There is no distension or abdominal bruit.     Palpations: Abdomen is soft.     Tenderness: There is no abdominal tenderness.  Genitourinary:    Uterus: Not tender.   Skin:    General: Skin is warm and dry.  Neurological:     General: No focal deficit present.     Mental Status: She is alert.  Psychiatric:        Mood and Affect: Mood normal.        Behavior: Behavior normal.     MAU Course   Procedures  MDM Pt presents 2 weeks after repeat D&E with pain. Her bleeding has improved. Initial Ddx included, but were not limited to, retained products, infection, uterus returning to nml state in a multip, ie uterine involution, return of cycle. Did not suspect former two problems given vitals and physical exam, however, this was necessary to r/o. 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 CBC not suggestive of infection at this time. CBC did not show evidence of large bleed, but seemed more consistent w/mild loss of blood post-procedure that is normal for 2-4 weeks. The shape of her uterus might be affecting the clearance of blood after procedure, and she might be having more pain and bleeding from her fibroid in addition. As she is multiparous, her uterus contracting back to nml size is expected to be more painful. The fibroid might have also grown in size while she was pregnant, and returning back to its previous size could also be causing significant pain. The fibroid is also on the right, where her pain seems to localize. CP does seem more consistent with spasm, either esophageal or laryngospasm after intubation, or throat pain due to intubation, as it improved with drinking tea and gargling salt water. It has resolved now, and her vital signs/physical exam were not concerning. Pt was given oxycodone with resolution of her pain while in the MAU.   Assessment and Plan  Uterine involution - Will send patient home with several days of oxycodone - She should continue Tylenol and Toradol - Instructed to return should she have worsening pain, bleeding, or signs of infection, including fever  Fibroid degeneration - Per above  Wilhemina Cash 07/26/2022, 12:44 PM

## 2022-07-26 NOTE — MAU Note (Signed)
Lindsey Hunt is a 41 y.o. at Unknown here in MAU reporting: having a lot of cramping, started getting worse 3 days ago. Is affecting her job.  Is unable to work because of the pain. Started having diarrhea yesterday. (Watery this morning) Bleeding is now brown, yesterday was kind of heavy, lighter today in amt.  Has 2nd procedure on 10/26 for retained, feels she should still be going through this.   F/u is 11/16 Onset of complaint: ongoing Pain score: 9 Vitals:   07/26/22 1225  BP: 136/89  Pulse: 67  Resp: 18  Temp: 98.4 F (36.9 C)  SpO2: 99%      Lab orders placed from triage:  none

## 2022-08-02 ENCOUNTER — Ambulatory Visit (INDEPENDENT_AMBULATORY_CARE_PROVIDER_SITE_OTHER): Payer: Medicaid Other | Admitting: Family Medicine

## 2022-08-02 ENCOUNTER — Encounter: Payer: Self-pay | Admitting: Family Medicine

## 2022-08-02 VITALS — BP 141/92 | HR 90 | Ht 66.0 in | Wt 200.3 lb

## 2022-08-02 DIAGNOSIS — O034 Incomplete spontaneous abortion without complication: Secondary | ICD-10-CM | POA: Diagnosis not present

## 2022-08-02 DIAGNOSIS — Z30011 Encounter for initial prescription of contraceptive pills: Secondary | ICD-10-CM

## 2022-08-02 DIAGNOSIS — R4589 Other symptoms and signs involving emotional state: Secondary | ICD-10-CM

## 2022-08-02 MED ORDER — SLYND 4 MG PO TABS: 1.0000 | ORAL_TABLET | Freq: Every day | ORAL | 3 refills | Status: DC

## 2022-08-02 NOTE — Assessment & Plan Note (Signed)
Still with cramping. Last u/s did not show RPOC just fluid--now only spotting. No increase in WBC. Should improve in next 2-3 weeks. Ibuprofen ok prn.

## 2022-08-02 NOTE — Progress Notes (Signed)
   Subjective:    Patient ID: Lindsey Hunt is a 41 y.o. female presenting with Post-op Follow-up  on 08/02/2022  HPI: Patient here today following a D&C for retained products of conception after D&C for 11-week missed AB.  Return to the hospital with ongoing abdominal cramping and spotting last week and had a formal ultrasound there that showed complex fluid in the endometrial canal but no longer having retained products of conception.  She had no elevated white count.  She denies fever or chills.  The patient reports mood dysregulation given all that she has been through the recently.  Review of Systems  Constitutional:  Negative for chills and fever.  Respiratory:  Negative for shortness of breath.   Cardiovascular:  Negative for chest pain.  Gastrointestinal:  Positive for abdominal pain (cramping). Negative for nausea and vomiting.  Genitourinary:  Negative for dysuria.  Skin:  Negative for rash.      Objective:    BP (!) 141/92   Pulse 90   Ht '5\' 6"'$  (1.676 m)   Wt 200 lb 4.8 oz (90.9 kg)   LMP  (LMP Unknown)   Breastfeeding Unknown   BMI 32.33 kg/m  Physical Exam Exam conducted with a chaperone present.  Constitutional:      General: She is not in acute distress.    Appearance: She is well-developed.  HENT:     Head: Normocephalic and atraumatic.  Eyes:     General: No scleral icterus. Cardiovascular:     Rate and Rhythm: Normal rate.  Pulmonary:     Effort: Pulmonary effort is normal.  Abdominal:     Palpations: Abdomen is soft.  Musculoskeletal:     Cervical back: Neck supple.  Skin:    General: Skin is warm and dry.  Neurological:     Mental Status: She is alert and oriented to person, place, and time.         Assessment & Plan:   Problem List Items Addressed This Visit       Unprioritized   Retained products of conception after miscarriage - Primary    Still with cramping. Last u/s did not show RPOC just fluid--now only spotting. No  increase in WBC. Should improve in next 2-3 weeks. Ibuprofen ok prn.      Other Visit Diagnoses     Encounter for initial prescription of contraceptive pills       does not desire pregnancy right now--will give Slynd--BP is elevated so will hold on any COCs.   Relevant Medications   Drospirenone (SLYND) 4 MG TABS   Depressed mood       Has seen jamie--f/u arranged   Relevant Orders   Ambulatory referral to [facility]      Return in about 3 months (around 11/02/2022) for a CPE.  Donnamae Jude, MD 08/02/2022 2:37 PM

## 2022-08-02 NOTE — Patient Instructions (Signed)
[facility] [address] [place], [place] [address] [phone] (main office) [phone] (Yoshiaki Kreuser's office)   

## 2022-08-06 NOTE — BH Specialist Note (Addendum)
Integrated Behavioral Health via Telemedicine Visit  08/17/2022 Lindsey Hunt [id number]  Number of [facility] Clinician visits: 1- Initial Visit  Session Start time: 1607   Session End time: 3710  Total time in minutes: 47   Referring Provider: Darron Doom, MD Patient/Family location: Home [facility] Provider location: [facility]  All persons participating in visit: Patient Lindsey Hunt and Lindsey Hunt   Types of Service: Individual psychotherapy and Video visit  I connected with Lindsey Hunt and/or Lindsey Hunt's  n/a  via  Telephone or Video Enabled Telemedicine Application  (Video is Caregility application) and verified that I am speaking with the correct person using two identifiers. Discussed confidentiality: Yes   I discussed the limitations of telemedicine and the availability of in person appointments.  Discussed there is a possibility of technology failure and discussed alternative modes of communication if that failure occurs.  I discussed that engaging in this telemedicine visit, they consent to the provision of behavioral healthcare and the services will be billed under their insurance.  Patient and/or legal guardian expressed understanding and consented to Telemedicine visit: Yes   Presenting Concerns: Patient and/or family reports the following symptoms/concerns: Restlessness, poor sleep quality(about 4 hours nightly), poor appetite, fatigue, anxiety, worry, irritability; took medication to treat bipolar disorder last over 10 years ago; attributes current symptoms to life stress: working full-time, in school full-time (4.0  GPA), financial stress and grieving loss of pregnancy due to ectopic.  Duration of problem: Ongoing with increase symptoms after pregnancy loss; Severity of problem:  moderately severe  Patient and/or Family's Strengths/Protective  Factors: Concrete supports in place (healthy food, safe environments, etc.) and Sense of purpose  Goals Addressed: Patient will:  Reduce symptoms of: anxiety, depression, and stress   Increase knowledge and/or ability of: self-management skills   Demonstrate ability to: Increase healthy adjustment to current life circumstances and Increase motivation to adhere to plan of care  Progress towards Goals: Ongoing  Interventions: Interventions utilized:  Mindfulness or Psychologist, educational, Psychoeducation and/or Health Education, and Link to [employer] Standardized Assessments completed: Not Needed  Patient and/or Family Response: Patient agrees with treatment plan.   Assessment: Patient currently experiencing Grief, Bipolar affective disorder, unspecified (previously diagnosed by psychiatry); Psychosocial stress.   Patient may benefit from psychoeducation and brief therapeutic interventions regarding coping with symptoms of anxiety, depression, life stress .  Plan: Follow up with behavioral health clinician on : Two weeks Behavioral recommendations:  -Accept referral to psychiatry -CALM relaxation breathing exercise twice daily (morning; at bedtime with sleep sounds); as needed throughout the day. -Continue plan to begin school paper tonight (outline, etc) -Read through After Visit Summary; use information as needed Referral(s): [facility] (In Clinic), [facility] (LME/Outside Clinic), and Community Resources:  Food and WIC, Pregnancy loss support, [facility]  I discussed the assessment and treatment plan with the patient and/or parent/guardian. They were provided an opportunity to ask questions and all were answered. They agreed with the plan and demonstrated an understanding of the instructions.   They were advised to call back or seek an in-person evaluation if the symptoms worsen or if the condition fails to improve as  anticipated.  Garlan Fair, LCSW     08/02/2022    9:56 AM  Depression screen PHQ 2/9  Decreased Interest 2  Down, Depressed, Hopeless 2  PHQ - 2 Score 4  Altered sleeping 2  Tired,  decreased energy 3  Change in appetite 3  Feeling bad or failure about yourself  0  Trouble concentrating 2  Moving slowly or fidgety/restless 1  Suicidal thoughts 0  PHQ-9 Score 15  Difficult doing work/chores Not difficult at all      08/02/2022    9:56 AM  GAD 7 : Generalized Anxiety Score  Nervous, Anxious, on Edge 2  Control/stop worrying 3  Worry too much - different things 3  Trouble relaxing 3  Restless 1  Easily annoyed or irritable 3  Afraid - awful might happen 2  Total GAD 7 Score 17  Anxiety Difficulty Not difficult at all

## 2022-08-13 ENCOUNTER — Ambulatory Visit (INDEPENDENT_AMBULATORY_CARE_PROVIDER_SITE_OTHER): Payer: Medicaid Other | Admitting: Clinical

## 2022-08-13 DIAGNOSIS — F3160 Bipolar disorder, current episode mixed, unspecified: Secondary | ICD-10-CM

## 2022-08-13 DIAGNOSIS — F4321 Adjustment disorder with depressed mood: Secondary | ICD-10-CM | POA: Diagnosis not present

## 2022-08-13 DIAGNOSIS — Z658 Other specified problems related to psychosocial circumstances: Secondary | ICD-10-CM

## 2022-08-13 NOTE — Patient Instructions (Addendum)
Center for [facility] [place], [place] [address] [phone] (main office) [phone] (Guys office)  [employer] ([place]) [email or web address]  [employer] ([place]) [email or web address]  P[employer] [email or web address].net  [facility]  [address] [place], [place] [address] [phone] or [phone] [facility] 24/7 FOR ANYONE [address] [place], [place] Fax: [phone] [email or web address] *Interpreters available *Accepts all insurance and uninsured for Urgent Care needs *Accepts Medicaid and uninsured for outpatient treatment (below)    ONLY FOR [facility]  Below:   Outpatient New Patient Assessment/Therapy Walk-ins:        Monday -Thursday 8am until slots are full.        Every Friday 1pm-4pm  (first come, first served)                   New Patient Psychiatry/Medication Management        Monday-Friday 8am-11am (first come, first served)              For all walk-ins we ask that you arrive by 7:15am, because patients will be seen in the order of arrival.    Coping with Panic Attacks   What is a panic attack?  You may have had a panic attack if you experienced four or more of the symptoms listed below coming on abruptly and peaking in about 10 minutes.  Panic Symptoms    Pounding heart   Sweating   Trembling or shaking   Shortness of breath   Feeling of choking   Chest pain   Nausea or abdominal distress     Feeling dizzy, unsteady, lightheaded, or faint   Feelings of unreality or being detached from yourself   Fear of losing control or going crazy   Fear of dying   Numbness or tingling    Chills or hot flashes      Panic attacks are sometimes accompanied by avoidance of certain places or situations. These are often situations that would be difficult to escape from or in which help might not be available. Examples might include crowded shopping malls, public transportation, restaurants, or driving.   Why do panic attacks occur?   Panic attacks are the body's alarm system gone awry. All of [place] have a built-in alarm system, powered by adrenaline, which increases our heart rate, breathing, and blood flow in response to danger. Ordinarily, this 'danger response system' works well. In some people, however, the response is either out of proportion to whatever stress is going on, or may come out of the blue without any stress at all.   For example, if you are walking in the woods and see a bear coming your way, a variety of changes occur in your body to prepare you to either fight the danger or flee from the situation. Your heart rate will increase to get more blood flow around your body, your breathing rate will quicken so that more oxygen is available, and your muscles will tighten in order to be ready to fight or run. You may feel nauseated as blood flow leaves your stomach area and moves into your limbs. These bodily changes are all essential to helping you survive the dangerous situation. After the danger has passed, your body functions will begin to go back to normal. This is because your body also has a system for "recovering" by bringing your body back down to a normal state  when the danger is over.   As you can see, the emergency response system is adaptive when there is, in fact, a "true" or "real" danger (e.g., bear). However, sometimes people find that their emergency response system is triggered in "everyday" situations where there really is no true physical danger (e.g., in a meeting, in the grocery store, while driving in normal traffic, etc.).   What triggers a panic attack?   Sometimes particularly stressful situations can trigger a panic attack. For example, an argument with your spouse or stressors at work can cause a stress response (activating the emergency response system) because you perceive it as threatening or overwhelming, even if there is no direct risk to your survival.  Sometimes panic attacks don't seem to be triggered by anything in particular- they may "come out of the blue". Somehow, the natural "fight or flight" emergency response system has gotten activated when there is no real danger. Why does the body go into "emergency mode" when there is no real danger?   Often, people with panic attacks are frightened or alarmed by the physical sensations of the emergency response system. First, unexpected physical sensations are experienced (tightness in your chest or some shortness of breath). This then leads to feeling fearful or alarmed by these symptoms ("Something's wrong!", "Am I having a heart attack?", "Am I going to faint?") The mind perceives that there is a danger even though no real danger exists. This, in turn, activates the emergency response system ("fight or flight"), leading to a "full blown" panic attack. In summary, panic attacks occur when we misinterpret physical symptoms as signs of impending death, craziness, loss of control, embarrassment, or fear of fear. Sometimes you may be aware of thoughts of danger that activate the emergency response system (for example, thinking "I'm having a heart attack" when you feel chest pressure or increased heart rate). At other times, however, you may not be aware of such thoughts. After several incidences of being afraid of physical sensations, anxiety and panic can occur in response to the initial sensations without conscious thoughts of danger. Instead, you just feel afraid or alarmed. In other words, the panic or fear may seem to occur "automatically" without you consciously telling yourself anything.   After  having had one or more panic attacks, you may also become more focused on what is going on inside your body. You may scan your body and be more vigilant about noticing any symptoms that might signal the start of a panic attack. This makes it easier for panic attacks to happen again because you pick up on sensations you might otherwise not have noticed, and misinterpret them as something dangerous. A panic attack may then result.      How do I cope with panic attacks?  An important part of overcoming panic attacks involves re-interpreting your body's physical reactions and teaching yourself ways to decrease the physical arousal. This can be done through practicing the cognitive and behavioral interventions below.   Research has found that over half of people who have panic attacks show some signs of hyperventilation or overbreathing. This can produce initial sensations that alarm you and lead to a panic attack. Overbreathing can also develop as part of the panic attack and make the symptoms worse. When people hyperventilate, certain blood vessels in the body become narrower. In particular, the brain may get slightly less oxygen. This can lead to the symptoms of dizziness, confusion, and lightheadedness that often occur during panic attacks. Other parts  of the body may also get a bit less oxygen, which may lead to numbness or tingling in the hands or feet or the sensation of cold, clammy hands. It also may lead the heart to pump harder. Although these symptoms may be frightening and feel unpleasant, it is important to remember that hyperventilating is not dangerous. However, you can help overcome the unpleasantness of overbreathing by practicing Breathing Retraining.   Practice this basic technique three times a day, every day:   Inhale. With your shoulders relaxed, inhale as slowly and deeply as you can while you count to six. If you can, use your diaphragm to fill your lungs with air.   Hold. Keep the  air in your lungs as you slowly count to four.   Exhale. Slowly breath out as you count to six.   Repeat. Do the inhale-hold-exhale cycle several times. Each time you do it, exhale for longer counts.  Like any new skill, Breathing Retraining requires practice. Try practicing this skill twice a day for several minutes. Initially, do not try this technique in specific situations or when you become frightened or have a panic attack. Begin by practicing in a quiet environment to build up your skill level so that you can later use it in time of "emergency."   2. Decreasing Avoidance  Regardless of whether you can identify why you began having panic attacks or whether they seemed to come out of the blue, the places where you began having panic attacks often can become triggers themselves. It is not uncommon for individuals to begin to avoid the places where they have had panic attacks. Over time, the individual may begin to avoid more and more places, thereby decreasing their activities and often negatively impacting their quality of life. To break the cycle of avoidance, it is important to first identify the places or situations that are being avoided, and then to do some "relearning."  To begin this intervention, first create a list of locations or situations that you tend to avoid. Then choose an avoided location or situation that you would like to target first. Now develop an "exposure hierarchy" for this situation or location. An "exposure hierarchy" is a list of actions that make you feel anxious in this situation. Order these actions from least to most anxiety-producing. It is often helpful to have the first item on your hierarchy involve thinking or imagining part of the feared/avoided situation.   Here is an example of an exposure hierarchy for decreasing avoidance of the grocery store. Note how it is ordered from the least amount of anxiety (at the top) to the most anxiety (at the bottom):   Think  about going to the grocery store alone.   Go to the grocery store with a friend or family member.   Go to the grocery store alone to pick up a few small items (5-10 minutes in the store).   Shopping for 10-20 minutes in the store alone.   Doing the shopping for the week by myself (20-30 minutes in the store).   Your homework is to "expose" yourself to the lowest item on your hierarchy and use your breathing relaxation and coping statements (see below) to help you remain in the situation. Practice this several times during the upcoming week. Once you have mastered each item with minimal anxiety, move on to the next higher action on your list.   Cognitive Interventions  Identify your negative self-talk Anxious thoughts can increase anxiety symptoms and panic.  The first step in changing anxious thinking is to identify your own negative, alarming self-talk. Some common alarming thoughts:  I'm having a heart attack.            I must be going crazy. I think I'm dying. People will think I'm crazy. I'm going to pass our.  Oh no- here it comes.  I can't stand this.  I've got to get out of here!  2. Use positive coping statements Changing or disrupting a pattern of anxious thoughts by replacing them with more calming or supportive statements can help to divert a panic attack. Some common helpful coping statements:  This is not an emergency.  I don't like feeling this way, but I can accept it.  I can feel like this and still be okay.  This has happened before, and I was okay. I'll be okay this time, too.  I can be anxious and still deal with this situation.

## 2022-08-20 ENCOUNTER — Telehealth: Payer: Self-pay | Admitting: Family Medicine

## 2022-08-20 NOTE — BH Specialist Note (Signed)
Integrated Behavioral Health via Telemedicine Visit  08/29/2022 KAMMI HECHLER [id number]  Number of [facility] Clinician visits: 2- Second Visit  Session Start time: 4458   Session End time: 1638  Total time in minutes: 1  Pt arrived to video visit; declines further [facility] services at this time. Pt is aware that if she changes her mind, she may call [phone] to schedule a follow up visit as needed.    Caroleen Hamman Melysa Schroyer, LCSW

## 2022-08-20 NOTE — Telephone Encounter (Signed)
Want to discuss her last appointment and she said she have a few other questions.

## 2022-08-21 ENCOUNTER — Telehealth: Payer: Self-pay

## 2022-08-21 NOTE — Telephone Encounter (Signed)
complete

## 2022-08-21 NOTE — Telephone Encounter (Signed)
Called patient stating I am returning her phone call. Patient is upset that she is being charged a $15 and was never informed. Patient states her papers have been filled out 2 other times and no one ever mentioned a fee to her. Patient states she is not currently working and is behind on her rent. Asked to place patient on hold while I speak with my office manager.   Told patient we can bill her account the $15, rather than pay right now. Patient verbalized understanding and was okay with that. She requests a call back to be informed if the papers have been resubmitted this time and wants someone to review the information written on there so she can ensure it's correct, as it has been wrong before. Told patient I would pass along her message. Patient verbalized understanding.

## 2022-08-23 ENCOUNTER — Telehealth: Payer: Self-pay

## 2022-08-23 NOTE — Telephone Encounter (Signed)
Complete

## 2022-08-23 NOTE — Telephone Encounter (Signed)
Contacted the patient regarding her FMLA forms. Patient had D&C after [facility]AB on 07/12/22. Our office completed the forms for 07/12/2022 through 08/16/2022. Patient contacted the office again stating the dates were wrong. After conversation with patient she is requesting intermittent FMLA from 06/19/22 through 06/20/2023 for [facility]. I informed the patient that we could not complete 1 year of leave for [facility]. She has an apt on 12/13 with [facility] and I informed her Roselyn Reef may be able to make a referral to psychiatry if warranted and they may be able to complete the forms for her. Patient hung up and I spoke with Roselyn Reef regarding this case and she will be reaching out to the patient as well.

## 2022-08-29 ENCOUNTER — Ambulatory Visit: Payer: Medicaid Other | Admitting: Clinical

## 2022-08-29 DIAGNOSIS — Z658 Other specified problems related to psychosocial circumstances: Secondary | ICD-10-CM

## 2022-08-29 DIAGNOSIS — F4321 Adjustment disorder with depressed mood: Secondary | ICD-10-CM

## 2022-08-29 DIAGNOSIS — F3160 Bipolar disorder, current episode mixed, unspecified: Secondary | ICD-10-CM

## 2022-08-29 NOTE — Addendum Note (Signed)
Addended by: Vesta Mixer C on: 08/29/2022 11:32 AM   Modules accepted: Orders

## 2022-09-26 ENCOUNTER — Ambulatory Visit
Admission: EM | Admit: 2022-09-26 | Discharge: 2022-09-26 | Disposition: A | Discharge status: Home / Self Care | Payer: Medicaid Other | Attending: Physician Assistant | Admitting: Physician Assistant

## 2022-09-26 DIAGNOSIS — R112 Nausea with vomiting, unspecified: Secondary | ICD-10-CM | POA: Diagnosis not present

## 2022-09-26 DIAGNOSIS — R519 Headache, unspecified: Secondary | ICD-10-CM

## 2022-09-26 LAB — POCT URINALYSIS DIP (MANUAL ENTRY)
Bilirubin, UA: NEGATIVE
Blood, UA: NEGATIVE
Glucose, UA: NEGATIVE mg/dL
Ketones, POC UA: NEGATIVE mg/dL
Leukocytes, UA: NEGATIVE
Nitrite, UA: NEGATIVE
Spec Grav, UA: >=1.03 — AB (ref 1.010–1.025)
Urobilinogen, UA: 1 E.U./dL
pH, UA: 6 (ref 5.0–8.0)

## 2022-09-26 LAB — POCT URINE PREGNANCY: Preg Test, Ur: NEGATIVE

## 2022-09-26 LAB — POCT FASTING CBG KUC MANUAL ENTRY: POCT Glucose (KUC): 85 mg/dL (ref 70–99)

## 2022-09-26 MED ORDER — ONDANSETRON HCL 4 MG/2ML IJ SOLN: 4.0000 mg | Freq: Once | INTRAMUSCULAR | Status: DC

## 2022-09-26 MED ORDER — ONDANSETRON 4 MG PO TBDP: 4.0000 mg | ORAL_TABLET | Freq: Three times a day (TID) | ORAL | 0 refills | Status: DC | PRN

## 2022-09-26 MED ORDER — KETOROLAC TROMETHAMINE 30 MG/ML IJ SOLN
30.0000 mg | Freq: Once | INTRAMUSCULAR | Status: AC
  Administered 2022-09-26: 30 mg via INTRAMUSCULAR

## 2022-09-26 NOTE — ED Triage Notes (Signed)
Pt presents with vomiting and headache X 2 days.

## 2022-09-26 NOTE — ED Provider Notes (Signed)
[facility]    CSN: [id number] Arrival date & time: 09/26/22  1719      History   Chief Complaint Chief Complaint  Patient presents with   Emesis   Headache    HPI Lindsey Hunt is a 42 y.o. female.   Patient here today for evaluation of headache and intermittent vomiting that started 2 days ago. She reports only a few episodes of vomiting that is random. She is unable to describe her pain, unable to give location of pain, unable to give severity of pain. Has been able to eat but states she does not eat much at baseline. She has not had fever. She denies any numbness or tingling. She has not had any cough or congestion.  She is unsure of pregnancy. She has not taken any medication for symptoms.  The history is provided by the patient.  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 07/12/2022    Past Surgical History:  Procedure Laterality Date   BUNIONECTOMY Left    DILATION AND CURETTAGE OF UTERUS N/A 06/26/2022   Procedure: SUCTION DILATATION AND CURETTAGE;  Surgeon: Florian Buff, MD;  Location: [place];  Service: Gynecology;  Laterality: N/A;   DILATION AND EVACUATION N/A 07/12/2022   Procedure: DILATATION AND EVACUATION;  Surgeon: Donnamae Jude, MD;  Location: [place];  Service: Gynecology;  Laterality: N/A;    OB History     Gravida  9   Para  6   Term  6   Preterm  0   AB  3   Living  6      SAB  2   IAB   0   Ectopic  1   Multiple  0   Live Births  6            Home Medications    Prior to Admission medications   Medication Sig Start Date End Date Taking? Authorizing Provider  ondansetron (ZOFRAN-ODT) 4 MG disintegrating tablet Take 1 tablet (4 mg total) by mouth every 8 (eight) hours as needed. 09/26/22  Yes Francene Finders, PA-C  Drospirenone (SLYND) 4 MG TABS Take 1 tablet by mouth daily. 08/02/22   Donnamae Jude, MD  oxyCODONE (ROXICODONE) 5 MG immediate release tablet Take 1 tablet (5 mg total) by mouth every 4 (four) hours as needed for severe pain. 07/26/22   Clarnce Flock, MD    Family History Family History  Family history unknown: Yes    Social History Social History   Tobacco Use   Smoking status: Former   Smokeless tobacco: Never   Tobacco comments:    06/25/22- "long time ago."  Vaping Use   Vaping Use: Never used  Substance Use Topics   Alcohol use: Yes    Comment: sometimes,"I do not count how many at a time". Patient 's sister called triage at  [facility] to report that patient had been drinking alot since she lost the baby.   Drug use: No     Allergies   Patient has no known allergies.   Review of Systems Review of Systems  Constitutional:  Negative for chills and fever.  HENT:  Negative for congestion and sore throat.   Eyes:  Negative for discharge and redness.  Respiratory:  Negative for cough and shortness of breath.   Gastrointestinal:  Positive for nausea and vomiting. Negative for abdominal pain and diarrhea.  Neurological:  Positive for headaches. Negative for numbness.     Physical Exam Triage Vital Signs ED Triage Vitals [09/26/22 1900]  Enc Vitals Group     BP 127/87     Pulse Rate 71     Resp 17     Temp 98.4 F (36.9 C)     Temp Source Oral     SpO2 97 %     Weight      Height      Head Circumference      Peak Flow      Pain Score 5     Pain Loc      Pain Edu?      Excl. in [place]?    No data  found.  Updated Vital Signs BP 127/87 (BP Location: Left Arm)   Pulse 71   Temp 98.4 F (36.9 C) (Oral)   Resp 17   SpO2 97%      Physical Exam Vitals and nursing note reviewed.  Constitutional:      General: She is not in acute distress.    Appearance: Normal appearance. She is not ill-appearing.  HENT:     Head: Normocephalic and atraumatic.  Eyes:     Extraocular Movements: Extraocular movements intact.     Conjunctiva/sclera: Conjunctivae normal.     Pupils: Pupils are equal, round, and reactive to light.  Cardiovascular:     Rate and Rhythm: Normal rate and regular rhythm.     Heart sounds: Normal heart sounds.  Pulmonary:     Effort: Pulmonary effort is normal. No respiratory distress.     Breath sounds: Normal breath sounds. No wheezing, rhonchi or rales.  Neurological:     Mental Status: She is alert.  Psychiatric:        Behavior: Behavior normal.        Thought Content: Thought content normal.     Comments: Patient seems irritated, short answers, somewhat confrontational      UC Treatments / Results  Labs (all labs ordered are listed, but only abnormal results are displayed) Labs Reviewed  POCT URINALYSIS DIP (MANUAL ENTRY) - Abnormal; Notable for the following components:      Result Value   Spec Grav, UA >=1.030 (*)    Protein Ur, POC trace (*)    All other components within normal limits  POCT URINE PREGNANCY  POCT FASTING CBG [place]    EKG   Radiology No results found.  Procedures Procedures (including critical care time)  Medications Ordered in UC Medications  ketorolac (TORADOL) 30 MG/ML injection 30 mg (30 mg Intramuscular Given 09/26/22 1948)    Initial Impression / Assessment and Plan / UC Course  I have reviewed the triage vital signs and the nursing notes.  Pertinent labs & imaging results that were available during my care of the patient were reviewed by me and considered in my medical decision making (see chart for  details).  Pregnancy test  negative in office. Will treat with toradol injection. Offered zofran injection which patient declines. ODT zofran prescribed. Recommended increased fluids and follow up with any concerns.    Final Clinical Impressions(s) / UC Diagnoses   Final diagnoses:  Acute nonintractable headache, unspecified headache type  Nausea and vomiting, unspecified vomiting type   Discharge Instructions   None    ED Prescriptions     Medication Sig Dispense Auth. Provider   ondansetron (ZOFRAN-ODT) 4 MG disintegrating tablet Take 1 tablet (4 mg total) by mouth every 8 (eight) hours as needed. 20 tablet Francene Finders, PA-C      PDMP not reviewed this encounter.   Francene Finders, PA-C 09/27/22 [phone]

## 2022-09-27 ENCOUNTER — Encounter: Payer: Self-pay | Admitting: Physician Assistant

## 2022-10-18 ENCOUNTER — Encounter (HOSPITAL_COMMUNITY): Payer: Self-pay | Admitting: Psychiatry

## 2022-10-18 ENCOUNTER — Ambulatory Visit (INDEPENDENT_AMBULATORY_CARE_PROVIDER_SITE_OTHER): Payer: Medicaid Other | Admitting: Psychiatry

## 2022-10-18 DIAGNOSIS — G4709 Other insomnia: Secondary | ICD-10-CM

## 2022-10-18 DIAGNOSIS — F17200 Nicotine dependence, unspecified, uncomplicated: Secondary | ICD-10-CM | POA: Insufficient documentation

## 2022-10-18 DIAGNOSIS — D649 Anemia, unspecified: Secondary | ICD-10-CM

## 2022-10-18 DIAGNOSIS — G4700 Insomnia, unspecified: Secondary | ICD-10-CM | POA: Insufficient documentation

## 2022-10-18 DIAGNOSIS — Z87891 Personal history of nicotine dependence: Secondary | ICD-10-CM | POA: Insufficient documentation

## 2022-10-18 DIAGNOSIS — F332 Major depressive disorder, recurrent severe without psychotic features: Secondary | ICD-10-CM | POA: Diagnosis not present

## 2022-10-18 DIAGNOSIS — F1090 Alcohol use, unspecified, uncomplicated: Secondary | ICD-10-CM

## 2022-10-18 DIAGNOSIS — G4721 Circadian rhythm sleep disorder, delayed sleep phase type: Secondary | ICD-10-CM | POA: Insufficient documentation

## 2022-10-18 DIAGNOSIS — F411 Generalized anxiety disorder: Secondary | ICD-10-CM

## 2022-10-18 DIAGNOSIS — F410 Panic disorder [episodic paroxysmal anxiety] without agoraphobia: Secondary | ICD-10-CM

## 2022-10-18 DIAGNOSIS — R0683 Snoring: Secondary | ICD-10-CM

## 2022-10-18 MED ORDER — SERTRALINE HCL 25 MG PO TABS: ORAL_TABLET | ORAL | 1 refills | Status: DC

## 2022-10-18 NOTE — Patient Instructions (Addendum)
We added Zoloft 12.5 mg (half a tablet) once daily for the next 2 weeks.  If you are tolerating this medication well increase to a full tablet at the 2-week point.  In the meantime please reach out to your insurer to find a PCP that is in network for you.  Once you are established with 1 the blood test that we want to obtain are an iron panel, vitamin D, vitamin B12, TSH level.  They may also consider doing a sleep study given your snoring.  You can also begin to work through this [place] PDF as I think a lot of the concepts in there you will find are consistent with what you have been telling me: AffordableShare.co.za.pdf  As you are able please cut back on the amount of alcohol that you are drinking as alcohol consumption can make your mood more volatile, your depression worse, your anxiety worse, and impact your ability to get restful sleep.
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
## 2022-10-19 ENCOUNTER — Ambulatory Visit (HOSPITAL_COMMUNITY): Payer: Medicaid Other | Admitting: Psychiatry

## 2022-11-09 ENCOUNTER — Other Ambulatory Visit (HOSPITAL_COMMUNITY): Payer: Self-pay | Admitting: Psychiatry

## 2022-11-09 DIAGNOSIS — F332 Major depressive disorder, recurrent severe without psychotic features: Secondary | ICD-10-CM

## 2022-11-09 DIAGNOSIS — F410 Panic disorder [episodic paroxysmal anxiety] without agoraphobia: Secondary | ICD-10-CM

## 2022-11-19 ENCOUNTER — Telehealth (HOSPITAL_COMMUNITY): Payer: Medicaid Other | Admitting: Psychiatry

## 2022-12-07 ENCOUNTER — Telehealth (INDEPENDENT_AMBULATORY_CARE_PROVIDER_SITE_OTHER): Payer: Medicaid Other | Admitting: Psychiatry

## 2022-12-07 DIAGNOSIS — F332 Major depressive disorder, recurrent severe without psychotic features: Secondary | ICD-10-CM

## 2022-12-07 DIAGNOSIS — G4709 Other insomnia: Secondary | ICD-10-CM

## 2022-12-07 DIAGNOSIS — F410 Panic disorder [episodic paroxysmal anxiety] without agoraphobia: Secondary | ICD-10-CM

## 2022-12-07 DIAGNOSIS — D649 Anemia, unspecified: Secondary | ICD-10-CM

## 2022-12-07 DIAGNOSIS — F1090 Alcohol use, unspecified, uncomplicated: Secondary | ICD-10-CM

## 2022-12-07 DIAGNOSIS — F411 Generalized anxiety disorder: Secondary | ICD-10-CM | POA: Diagnosis not present

## 2022-12-07 DIAGNOSIS — R0683 Snoring: Secondary | ICD-10-CM

## 2022-12-07 MED ORDER — MIRTAZAPINE 7.5 MG PO TABS: 7.5000 mg | ORAL_TABLET | Freq: Every day | ORAL | 1 refills | Status: DC
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
## 2022-12-10 ENCOUNTER — Encounter (HOSPITAL_COMMUNITY): Payer: Self-pay | Admitting: Psychiatry

## 2022-12-10 NOTE — Patient Instructions (Signed)
We added remeron (mirtazapine) 7.5mg  nightly to your regimen today. DO NOT MIX THIS WITH ALCOHOL. Do your best to cut back on the amount of alcohol you consume on the weekend and if you are able to completely abstain that would be preferential as mixing alcohol with the medication we are using is NOT SAFE. Your mood will start to improve if you are able to cut back. Do follow up with establishing care with a primary care provider in order to get the blood tests we discussed.

## 2022-12-13 ENCOUNTER — Other Ambulatory Visit (HOSPITAL_COMMUNITY): Payer: Self-pay | Admitting: Psychiatry

## 2022-12-13 DIAGNOSIS — F411 Generalized anxiety disorder: Secondary | ICD-10-CM

## 2022-12-13 DIAGNOSIS — F332 Major depressive disorder, recurrent severe without psychotic features: Secondary | ICD-10-CM

## 2023-01-07 ENCOUNTER — Telehealth (HOSPITAL_COMMUNITY): Payer: Medicaid Other | Admitting: Psychiatry

## 2023-01-08 ENCOUNTER — Telehealth (HOSPITAL_COMMUNITY): Payer: Self-pay

## 2023-01-08 NOTE — Telephone Encounter (Signed)
Pt called stating that she had some missing items on her FMLA paperwork. She asks for a follow up call at [phone]

## 2023-01-08 NOTE — Telephone Encounter (Signed)
Spoke with patient and informed her that all the forms that office received has been faxed back. Patient could not remember what form they are talking about so staff suggested to patient to call her FMLA/ADA people to see which for and have them fax it back to office. Patient agreed and verbalized understanding.

## 2023-01-17 ENCOUNTER — Telehealth (HOSPITAL_COMMUNITY): Payer: Medicaid Other | Admitting: Psychiatry

## 2023-02-13 ENCOUNTER — Ambulatory Visit (HOSPITAL_COMMUNITY): Payer: Medicaid Other | Admitting: Student
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
## 2023-02-20 ENCOUNTER — Encounter (HOSPITAL_COMMUNITY): Payer: 59 | Admitting: Student

## 2023-02-20 ENCOUNTER — Encounter (HOSPITAL_COMMUNITY): Payer: Self-pay

## 2023-02-20 ENCOUNTER — Telehealth (HOSPITAL_COMMUNITY): Payer: Self-pay | Admitting: Student

## 2023-02-25 ENCOUNTER — Telehealth (HOSPITAL_COMMUNITY): Payer: Self-pay | Admitting: *Deleted

## 2023-02-25 NOTE — Telephone Encounter (Signed)
Per pt she would like for provider to please complete her ADA paperwork to be updated. Per pt she was late for her appt with new provider and they can not update her forms due to her not being a patient. She would like to know if provider could please complete her ADA form.

## 2023-02-26 NOTE — Telephone Encounter (Signed)
Informed patient and she verbalized understanding and stated "ok".

## 2023-03-13 ENCOUNTER — Other Ambulatory Visit: Payer: Self-pay | Admitting: Physician Assistant

## 2023-03-13 DIAGNOSIS — Z79899 Other long term (current) drug therapy: Secondary | ICD-10-CM | POA: Diagnosis not present

## 2023-03-13 DIAGNOSIS — Z1231 Encounter for screening mammogram for malignant neoplasm of breast: Secondary | ICD-10-CM

## 2023-03-13 DIAGNOSIS — D509 Iron deficiency anemia, unspecified: Secondary | ICD-10-CM | POA: Diagnosis not present

## 2023-03-13 DIAGNOSIS — F3130 Bipolar disorder, current episode depressed, mild or moderate severity, unspecified: Secondary | ICD-10-CM | POA: Insufficient documentation

## 2023-03-26 DIAGNOSIS — D75839 Thrombocytosis, unspecified: Secondary | ICD-10-CM | POA: Insufficient documentation

## 2023-03-26 DIAGNOSIS — F5104 Psychophysiologic insomnia: Secondary | ICD-10-CM | POA: Insufficient documentation

## 2023-03-26 DIAGNOSIS — K219 Gastro-esophageal reflux disease without esophagitis: Secondary | ICD-10-CM | POA: Insufficient documentation

## 2023-03-26 DIAGNOSIS — N926 Irregular menstruation, unspecified: Secondary | ICD-10-CM | POA: Insufficient documentation

## 2023-03-26 DIAGNOSIS — E559 Vitamin D deficiency, unspecified: Secondary | ICD-10-CM | POA: Insufficient documentation

## 2023-03-26 DIAGNOSIS — F410 Panic disorder [episodic paroxysmal anxiety] without agoraphobia: Secondary | ICD-10-CM | POA: Insufficient documentation

## 2023-03-27 ENCOUNTER — Ambulatory Visit
Admission: RE | Admit: 2023-03-27 | Discharge: 2023-03-27 | Disposition: A | Discharge status: Home / Self Care | Payer: Medicaid Other | Source: Ambulatory Visit | Attending: Physician Assistant | Admitting: Physician Assistant

## 2023-03-27 DIAGNOSIS — Z1231 Encounter for screening mammogram for malignant neoplasm of breast: Secondary | ICD-10-CM

## 2023-04-11 ENCOUNTER — Ambulatory Visit (HOSPITAL_COMMUNITY): Payer: Medicaid Other | Admitting: Student

## 2023-04-15 ENCOUNTER — Other Ambulatory Visit: Payer: Self-pay

## 2023-04-15 ENCOUNTER — Inpatient Hospital Stay: Payer: 59 | Attending: Hematology and Oncology | Admitting: Hematology and Oncology

## 2023-04-15 ENCOUNTER — Inpatient Hospital Stay: Payer: 59

## 2023-04-15 VITALS — BP 141/91 | HR 63 | Temp 97.9°F | Resp 18 | Ht 66.0 in | Wt 199.6 lb

## 2023-04-15 DIAGNOSIS — Z87891 Personal history of nicotine dependence: Secondary | ICD-10-CM | POA: Diagnosis not present

## 2023-04-15 DIAGNOSIS — D649 Anemia, unspecified: Secondary | ICD-10-CM

## 2023-04-15 DIAGNOSIS — D509 Iron deficiency anemia, unspecified: Secondary | ICD-10-CM | POA: Diagnosis not present

## 2023-04-15 LAB — IRON AND IRON BINDING CAPACITY (CC-WL,HP ONLY)
Iron: 15 ug/dL — ABNORMAL LOW (ref 28–170)
Saturation Ratios: 3 % — ABNORMAL LOW (ref 10.4–31.8)
TIBC: 518 ug/dL — ABNORMAL HIGH (ref 250–450)
UIBC: 503 ug/dL — ABNORMAL HIGH (ref 148–442)

## 2023-04-15 LAB — CBC WITH DIFFERENTIAL (CANCER CENTER ONLY)
Abs Immature Granulocytes: 0.01 10*3/uL (ref 0.00–0.07)
Basophils Absolute: 0 10*3/uL (ref 0.0–0.1)
Basophils Relative: 1 %
Eosinophils Absolute: 0.1 10*3/uL (ref 0.0–0.5)
Eosinophils Relative: 1 %
HCT: 32.5 % — ABNORMAL LOW (ref 36.0–46.0)
Hemoglobin: 10.1 g/dL — ABNORMAL LOW (ref 12.0–15.0)
Immature Granulocytes: 0 %
Lymphocytes Relative: 23 %
Lymphs Abs: 1.5 10*3/uL (ref 0.7–4.0)
MCH: 24.4 pg — ABNORMAL LOW (ref 26.0–34.0)
MCHC: 31.1 g/dL (ref 30.0–36.0)
MCV: 78.5 fL — ABNORMAL LOW (ref 80.0–100.0)
Monocytes Absolute: 0.5 10*3/uL (ref 0.1–1.0)
Monocytes Relative: 7 %
Neutro Abs: 4.5 10*3/uL (ref 1.7–7.7)
Neutrophils Relative %: 68 %
Platelet Count: 518 10*3/uL — ABNORMAL HIGH (ref 150–400)
RBC: 4.14 MIL/uL (ref 3.87–5.11)
RDW: 17.6 % — ABNORMAL HIGH (ref 11.5–15.5)
WBC Count: 6.6 10*3/uL (ref 4.0–10.5)
nRBC: 0 % (ref 0.0–0.2)

## 2023-04-15 LAB — VITAMIN B12: Vitamin B-12: 375 pg/mL (ref 180–914)

## 2023-04-15 LAB — FOLATE: Folate: 4 ng/mL — ABNORMAL LOW (ref >5.9)

## 2023-04-15 NOTE — Assessment & Plan Note (Addendum)
Patient was referred to [place] for iron deficiency anemia. We do not have the labs drawn by her primary care physician.  Discussion: I discussed with the patient that there are only 2 causes of iron deficiency.  It is either bleeding or malabsorption.  Patient tells me that her menstrual cycles although there may be heavy there are not excessive.  Plan: Obtain CBC and iron studies along with B12 and folic acid levels. I will call the patient tomorrow to discuss lab results and determine the treatment plan. 3.  Patient states that she sees gynecology once a year and she has not had any issues related to her cycles.

## 2023-04-15 NOTE — Progress Notes (Unsigned)
[facility] CONSULT NOTE  Patient Care Team: Pcp, No as PCP - General  CHIEF COMPLAINTS/PURPOSE OF CONSULTATION:  Iron deficiency anemia  HISTORY OF PRESENTING ILLNESS:  Lindsey Hunt 42 y.o. female is here because of recent history of iron deficiency anemia.  Patient has been known to have iron deficiency for over 10 years.  Intermittently she has been on oral iron replacement therapy.  Most recent iron replacement therapy has been intolerant because of constipation issues.  She was referred to [place] for consideration for IV iron therapy.  I reviewed her records extensively and collaborated the history with the patient.   MEDICAL HISTORY:  Past Medical History:  Diagnosis Date   Anemia    Anxiety    Bipolar disorder (HCC)    Depression    Ectopic pregnancy    Fibroids    Headache     SURGICAL HISTORY: Past Surgical History:  Procedure Laterality Date   BUNIONECTOMY Left    DILATION AND CURETTAGE OF UTERUS N/A 06/26/2022   Procedure: SUCTION DILATATION AND CURETTAGE;  Surgeon: Lazaro Arms, MD;  Location: [facility] OR;  Service: Gynecology;  Laterality: N/A;   DILATION AND EVACUATION N/A 07/12/2022   Procedure: DILATATION AND EVACUATION;  Surgeon: Reva Bores, MD;  Location: [facility] OR;  Service: Gynecology;  Laterality: N/A;    SOCIAL HISTORY: Social History   Socioeconomic History   Marital status: Single    Spouse name: Not on file   Number of children: Not on file   Years of education: Not on file   Highest education level: Not on file  Occupational History   Not on file  Tobacco Use   Smoking status: Former   Smokeless tobacco: Never   Tobacco comments:    06/25/22- "long time ago."  Vaping Use   Vaping status: Never Used  Substance and Sexual Activity   Alcohol use: Yes    Comment: sometimes,"I do not count how many at a time". Patient 's sister called triage at [employer] [facility] to report that patient had been drinking alot since she lost the  baby.  On 10/18/2022 patient endorsed drinking bottle Tequila over the course of each weekend   Drug use: No   Sexual activity: Not Currently    Birth control/protection: None  Other Topics Concern   Not on file  Social History Narrative   Not on file   Social Determinants of Health   Financial Resource Strain: Not on file  Food Insecurity: Not on file  Transportation Needs: Not on file  Physical Activity: Not on file  Stress: Not on file  Social Connections: Unknown (01/16/2022)   Received from [facility]   Social Network    Social Network: Not on file  Intimate Partner Violence: Unknown (12/21/2021)   Received from [facility]   HITS    Physically Hurt: Not on file    Insult or Talk Down To: Not on file    Threaten Physical Harm: Not on file    Scream or Curse: Not on file    FAMILY HISTORY: Family History  Family history unknown: Yes    ALLERGIES:  has No Known Allergies.  MEDICATIONS:  Current Outpatient Medications  Medication Sig Dispense Refill   mirtazapine (REMERON) 7.5 MG tablet Take 1 tablet (7.5 mg total) by mouth at bedtime. 30 tablet 1   ondansetron (ZOFRAN-ODT) 4 MG disintegrating tablet Take 1 tablet (4 mg total) by mouth every 8 (eight) hours as needed. 20 tablet 0  sertraline (ZOLOFT) 50 MG tablet Take 1 tablet (50 mg total) by mouth daily. 30 tablet 1   No current facility-administered medications for this visit.    REVIEW OF SYSTEMS:   Constitutional: Denies fevers, chills or abnormal night sweats All other systems were reviewed with the patient and are negative.  PHYSICAL EXAMINATION: ECOG PERFORMANCE STATUS: 1 - Symptomatic but completely ambulatory  Vitals:   04/15/23 1449  BP: (!) 141/91  Pulse: 63  Resp: 18  Temp: 97.9 F (36.6 C)  SpO2: 100%   Filed Weights   04/15/23 1449  Weight: 199 lb 9.6 oz (90.5 kg)    GENERAL:alert, no distress and comfortable  LABORATORY DATA:  I have reviewed the data as listed Lab Results   Component Value Date   WBC 6.6 04/15/2023   HGB 10.1 (L) 04/15/2023   HCT 32.5 (L) 04/15/2023   MCV 78.5 (L) 04/15/2023   PLT 518 (H) 04/15/2023   Lab Results  Component Value Date   NA 138 04/23/2022   K 3.4 (L) 04/23/2022   CL 104 04/23/2022   CO2 24 04/23/2022    RADIOGRAPHIC STUDIES: I have personally reviewed the radiological reports and agreed with the findings in the report.  ASSESSMENT AND PLAN:  Anemia Patient was referred to [place] for iron deficiency anemia. We do not have the labs drawn by her primary care physician.  Discussion: I discussed with the patient that there are only 2 causes of iron deficiency.  It is either bleeding or malabsorption.  Patient tells me that her menstrual cycles although there may be heavy there are not excessive.  Plan: Obtain CBC and iron studies along with B12 and folic acid levels. I will call the patient tomorrow to discuss lab results and determine the treatment plan. 3.  Patient states that she sees gynecology once a year and she has not had any issues related to her cycles.   All questions were answered. The patient knows to call the clinic with any problems, questions or concerns.    Tamsen Meek, MD 04/15/23

## 2023-04-16 ENCOUNTER — Telehealth: Payer: Self-pay | Admitting: Hematology and Oncology

## 2023-04-16 ENCOUNTER — Encounter: Payer: Self-pay | Admitting: Hematology and Oncology

## 2023-04-16 DIAGNOSIS — D509 Iron deficiency anemia, unspecified: Secondary | ICD-10-CM | POA: Insufficient documentation

## 2023-04-16 NOTE — Telephone Encounter (Signed)
Scheduled appointments per 7/29 los. Patient is aware of the made appointments.

## 2023-04-24 NOTE — Progress Notes (Addendum)
BH MD Outpatient Progress Note  04/25/2023 4:28 PM Lindsey Hunt  MRN: [id number]  Assessment:  Lindsey Hunt presents for follow-up evaluation in-person. Today, 04/25/23, patient reports that none of medications are working, still having mood swings, not sleeping.  Identifying Information: Lindsey Hunt is a 42 y.o. 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   Risk Assessment: An assessment of suicide and violence risk factors was performed as part of this evaluation and is not significantly changed from the last visit.             While future psychiatric events cannot be accurately predicted, the patient does not currently require acute inpatient psychiatric care and does not currently meet [facility] involuntary commitment criteria.          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 Several medications are not helpful, however I am not surprised because her hemoglobin is around 10, she will be getting iron infusions later this week.  Expect that her mood will be better after she is repleted. Also patient has been taking Zoloft in the night and has been experiencing sleep delays, where she is unable to fall asleep until early morning, and will sleep until noon the next day.  This could be from the activating Zyprexa and Zoloft, instructed her to take it during the daytime to see if her sleep improves.  Will rule out other causes of mood, see labs below.   Interventions: INCREASED home Zoloft 100 mg to 150 mg once daily (s2/1/24, i2/15/24, i8/04/2023) Continue home  remeron 7.5 mg nightly Labs: CMP, vitamin D, TSH Referral to outpatient counseling   # Generalized anxiety disorder with panic attacks Past medication trials:  Status of problem: not improving Interventions: -- Zoloft and Remeron per above   # Circadian sleep rhythm disorder, delayed type Past medication trials:  Status of problem: not improving Initially endorsed only 3-4 hours of sleep, however on further conversation, patient sleeps a total about 12 hours total (sleeps ~11 PM-3 AM, then awake until 5 AM-1 PM). Her low energy and likely from her anemia.  Her sleeping of about 3-4 hours at night is likely from the Remeron she takes at before bed.  Also possibly that she cannot sleep for the remainder of the night because of activating side effects of Zoloft.  However if this continues to persist, considering recommending melatonin. Interventions: Remeron and changing time of medication per above Encouraged sleep hygiene    # Alcohol use disorder (no sz, no DT) Past medication trials: n/a Status of problem: worsening Contemplative No history of complicated withdrawal.  Started drinking again a few months back.  Drinking about 3 shots of tequila about 3 times a week.  Discussed naltrexone, which she is amenable to starting.  However laying on getting baseline LFTs prior to starting. Interventions: Labs per above Encouraged cessation   # Tobacco use disorder Past medication trials:  Status of problem: acute Lapsed on smoking a few months back as well.  Black and mild weekly.  Precontemplative. Interventions: Encouraged cessation   # Iron-deficiency anemia Past medication trials:  Status of problem: acute  Hb 10.1 (04/15/2023), iron study completed, showing iron deficiency anemia.  She will be getting IV iron later this week.  Suspect this will greatly improved her energy and mood. Interventions: Repletion per hem/onc  Health Maintenance PCP: Salvatore Decent, PA-C @ ? [facility]  Return to care in: Future Appointments  Date Time Provider Department Center  04/26/2023  2:15 PM [facility] INFUSION [facility] None  05/03/2023  2:00 PM [facility] INFUSION [facility] None  05/10/2023  2:30 PM [facility] INFUSION [facility] None  07/15/2023  2:00 PM [facility] LAB [facility] None  07/17/2023 11:45 AM Serena Croissant, MD [facility] None    Patient was given contact information for behavioral health clinic and was instructed to call 911 for emergencies.    Patient and plan of care will be discussed with the Attending MD, Dr. Josephina Shih, who agrees with the above statement and plan.   Subjective:  Chief Complaint:  Chief Complaint  Patient presents with   Anxiety   Stress   Alcohol Problem   Depression    Interval History:   Return to 8/11 Wants to go back to work  [employer] zoloft 4 days a week.   Needed vistaril 2x/day  Mood: "depression", anxious, irritable,   Low energy, just want to lay in bed, low motivation  Sleep: poor - 3 hrs/night - Reported issues falling asleep. - 1hr  - Reported issues staying asleep. - Denied issues with waking up too early.  For months, more than 6 mo  Caffeine: Denied  Appetite: Poor, eats about 1 meal a day  EtOH: yes - tequila 3x/week about 3 shots. Helps with sleep, ruminating thoughts.  Nicotine: black and mild once a week.  Cannabis: Denied Other substances: Denied  Patient amenable to med changes per above after discussing the risks, benefits, and side effects. Otherwise patient had no other questions or concerns and was amenable to plan per above.  Safety: Denied active and passive SI, HI, AVH, paranoia. Patient is aware of [facility], 988 and 911 as well. Denied access to guns or weapons.  Review of Systems  Constitutional:  Positive for fatigue.  Respiratory:  Negative for shortness of breath.   Cardiovascular:  Negative for chest pain.  Gastrointestinal:  Negative for abdominal pain,  constipation and diarrhea.  Neurological:  Negative for dizziness and light-headedness.    Visit Diagnosis:    ICD-10-CM   1. Major depressive disorder, recurrent severe without psychotic features (HCC)  F33.2     2. Generalized anxiety disorder with panic attacks  F41.1    F41.0     3. Alcohol use disorder  F10.90     4. Circadian rhythm sleep disorder, delayed sleep phase type  G47.21     5. Tobacco use disorder  F17.200       Past Psychiatric History:  Diagnoses: bipolar, MDD, GAD at 22, grief, alcohol use disorder, tobacco use disorder, cluster B traits Medication trials: only tried one but slowed her down and made it hard to take care of her kids Previous psychiatrist/therapist: yes therapy Hospitalizations: none Suicide attempts: none SIB: none Hx of violence towards others: none Current access to guns: none Hx of abuse: none that she can remember but at age 13 wondered if something happened to her in childhood and dreams about it. Sees herself touching a non-specific man; consistently she is a teenager  Substance Use History: EtOH:  reports current alcohol use. Nicotine: Black and mild weekly Marijuana: Denied IV drug use: Denied Stimulants: Denied Opiates: Denied Sedative/hypnotics:  Denied Hallucinogens: Denied DT: Denied Detox: Denied Residential: Denied Previous: Per above    Past Medical History: Dx:  has a past medical history of Anemia, Anxiety, Bipolar disorder (HCC), Depression, Ectopic pregnancy, Fibroids, Headache, and Retained products of conception after miscarriage (07/12/2022).  Head trauma: Denied Seizures: Denied Allergies: Patient has no known allergies.   Family Psychiatric History:  106 and 89 year old children with anger and emotional   Suicide: Sister attempted Homicide: Few uncles Psych hospitalization: littler sister BiPD: grandma SCZ/SCzA: Denied Substance use: Dad and mom   Social History:  Housing: Living with 3  children Income: Employed, recently FMLA over the past few months Children: 3x Support: Mom Guns/Weapons: Denied Legal: Denied  Past Medical History:  Past Medical History:  Diagnosis Date   Anemia    Anxiety    Bipolar disorder (HCC)    Depression    Ectopic pregnancy    Fibroids    Headache    Retained products of conception after miscarriage 07/12/2022    Past Surgical History:  Procedure Laterality Date   BUNIONECTOMY Left    DILATION AND CURETTAGE OF UTERUS N/A 06/26/2022   Procedure: SUCTION DILATATION AND CURETTAGE;  Surgeon: Lazaro Arms, MD;  Location: [facility] OR;  Service: Gynecology;  Laterality: N/A;   DILATION AND EVACUATION N/A 07/12/2022   Procedure: DILATATION AND EVACUATION;  Surgeon: Reva Bores, MD;  Location: [facility] OR;  Service: Gynecology;  Laterality: N/A;   Family History:  Family History  Family history unknown: Yes   Social History   Socioeconomic History   Marital status: Single    Spouse name: Not on file   Number of children: Not on file   Years of education: Not on file   Highest education level: Not on file  Occupational History   Not on file  Tobacco Use   Smoking status: Former   Smokeless tobacco: Never   Tobacco comments:    06/25/22- "long time ago."  Vaping Use   Vaping status: Never Used  Substance and Sexual Activity   Alcohol use: Yes    Comment: sometimes,"I do not count how many at a time". Patient 's sister called triage at [facility] to report that patient had been drinking alot since she lost the baby.  On 10/18/2022 patient endorsed drinking bottle Tequila over the course of each weekend   Drug use: No   Sexual activity: Not Currently    Birth control/protection: None  Other Topics Concern   Not on file  Social History Narrative   Not on file   Social Determinants of Health   Financial Resource Strain: Not on file  Food Insecurity: Not on file  Transportation Needs: Not on file  Physical Activity: Not on file   Stress: Not on file  Social Connections: Unknown (01/16/2022)   Received from [facility]   Social Network    Social Network: Not on file    Allergies: No Known Allergies  Current Medications: Current Outpatient Medications  Medication Sig Dispense Refill   mirtazapine (REMERON) 7.5 MG tablet Take 1 tablet (7.5 mg total) by mouth at bedtime. 30 tablet 1   sertraline (ZOLOFT) 50 MG tablet Take 1 tablet (50 mg total) by mouth daily. 30 tablet 1   No current facility-administered medications for this visit.    Objective: Psychiatric Specialty Exam: unknown if currently breastfeeding.There is no height or weight on file to calculate BMI.  General Appearance: Casual, faily groomed  Eye Contact:  Good  Speech:  Clear, coherent, normal rate   Volume:  Normal   Mood:  "Irritable, anxious"  Affect:  Appropriate, congruent, full range  Thought Content: Logical, rumination  Suicidal Thoughts: Denied active and passive SI    Thought Process:  Coherent, goal-directed, linear   Orientation:  A&Ox4   Memory:  Immediate good  Judgment:  Fair   Insight:  Shallow  Concentration:  Attention and concentration good   Recall:  Good  Fund of Knowledge: Good  Language: Good, fluent  Psychomotor Activity: normal  Akathisia:  NA   AIMS (if indicated): NA   Assets:  Communication Skills Desire for Improvement Financial Resources/Insurance Housing Leisure Time Physical Health Social Support Talents/Skills Transportation Vocational/Educational  ADL's:  Intact  Cognition: WNL  Sleep:  delayed    Physical Exam Vitals and nursing note reviewed.  Constitutional:      General: She is awake. She is not in acute distress.    Appearance: She is not ill-appearing or diaphoretic.  HENT:     Head: Normocephalic.  Pulmonary:     Effort: Pulmonary effort is normal. No respiratory distress.  Neurological:     General: No focal deficit present.     Mental Status: She is alert and oriented  to person, place, and time.     Cranial Nerves: No cranial nerve deficit.     Sensory: No sensory deficit.      Metabolic Disorder Labs: No results found for: "HGBA1C", "MPG" No results found for: "PROLACTIN" No results found for: "CHOL", "TRIG", "HDL", "CHOLHDL", "VLDL", "LDLCALC" No results found for: "TSH"  Therapeutic Level Labs: No results found for: "LITHIUM" No results found for: "VALPROATE" No results found for: "CBMZ"  Screenings: GAD-7    Flowsheet Row Office Visit from 08/02/2022 in [facility]  Total GAD-7 Score 17      PHQ2-9    Flowsheet Row Office Visit from 10/18/2022 in [facility] [place] Office Visit from 08/02/2022 in [facility] at [facility]  PHQ-2 Total Score 6 4  PHQ-9 Total Score 15 15      Flowsheet Row Office Visit from 10/18/2022 in [facility] [place] ED from 09/26/2022 in [facility]) Admission (Discharged) from 07/26/2022 in [facility]S Maternity Assessment Unit  C-SSRS RISK CATEGORY No Risk No Risk No Risk       Patient/Guardian was advised Release of Information must be obtained prior to any record release in order to collaborate their care with an outside provider. Patient/Guardian was advised if they have not already done so to contact the registration department to sign all necessary forms in order for [place] to release information regarding their care.   Consent: Patient/Guardian gives verbal consent for treatment and assignment of benefits for services provided during this visit. Patient/Guardian expressed understanding and agreed to proceed.   [name], DO Psych Resident, PGY-3

## 2023-04-25 ENCOUNTER — Ambulatory Visit
Admission: EM | Admit: 2023-04-25 | Discharge: 2023-04-25 | Disposition: A | Discharge status: Home / Self Care | Payer: 59

## 2023-04-25 ENCOUNTER — Ambulatory Visit (INDEPENDENT_AMBULATORY_CARE_PROVIDER_SITE_OTHER): Payer: 59 | Admitting: Student

## 2023-04-25 ENCOUNTER — Encounter (HOSPITAL_COMMUNITY): Payer: Self-pay | Admitting: Student

## 2023-04-25 ENCOUNTER — Encounter: Payer: Self-pay | Admitting: Emergency Medicine

## 2023-04-25 DIAGNOSIS — F411 Generalized anxiety disorder: Secondary | ICD-10-CM | POA: Diagnosis not present

## 2023-04-25 DIAGNOSIS — R112 Nausea with vomiting, unspecified: Secondary | ICD-10-CM

## 2023-04-25 DIAGNOSIS — F1090 Alcohol use, unspecified, uncomplicated: Secondary | ICD-10-CM | POA: Diagnosis not present

## 2023-04-25 DIAGNOSIS — F332 Major depressive disorder, recurrent severe without psychotic features: Secondary | ICD-10-CM | POA: Diagnosis not present

## 2023-04-25 DIAGNOSIS — F410 Panic disorder [episodic paroxysmal anxiety] without agoraphobia: Secondary | ICD-10-CM

## 2023-04-25 DIAGNOSIS — G4721 Circadian rhythm sleep disorder, delayed sleep phase type: Secondary | ICD-10-CM

## 2023-04-25 DIAGNOSIS — F17200 Nicotine dependence, unspecified, uncomplicated: Secondary | ICD-10-CM | POA: Diagnosis not present

## 2023-04-25 LAB — POCT URINE PREGNANCY: Preg Test, Ur: NEGATIVE

## 2023-04-25 MED ORDER — MIRTAZAPINE 15 MG PO TABS: 7.5000 mg | ORAL_TABLET | Freq: Every day | ORAL | 1 refills | Status: DC

## 2023-04-25 MED ORDER — SERTRALINE HCL 50 MG PO TABS
150.0000 mg | ORAL_TABLET | Freq: Every day | ORAL | 1 refills | Status: DC
Start: 2023-04-25 — End: 2023-06-04

## 2023-04-25 MED ORDER — ONDANSETRON 4 MG PO TBDP
4.0000 mg | ORAL_TABLET | Freq: Three times a day (TID) | ORAL | 0 refills | Status: DC | PRN
Start: 2023-04-25 — End: 2023-08-20

## 2023-04-25 NOTE — ED Provider Notes (Signed)
[facility]    CSN: [id number] Arrival date & time: 04/25/23  1938      History   Chief Complaint Chief Complaint  Patient presents with   Nausea   Emesis   Amenorrhea    HPI Lindsey Hunt is a 42 y.o. female.   Patient here today for evaluation of nausea, vomiting and missed menstrual period. She is concerned for possible pregnancy. She denies any abdominal pain. She has not had fever. She does not report treatment for symptoms.  The history is provided by the patient.    Past Medical History:  Diagnosis Date   Anemia    Anxiety    Bipolar disorder (HCC)    Depression    Ectopic pregnancy    Fibroids    Headache    Retained products of conception after miscarriage 07/12/2022    Patient Active Problem List   Diagnosis Date Noted   Iron deficiency anemia 04/16/2023   Major depressive disorder, recurrent severe without psychotic features (HCC) 10/18/2022   Generalized anxiety disorder with panic attacks 10/18/2022   Circadian rhythm sleep disorder, delayed sleep phase type 10/18/2022   Snoring 10/18/2022   Alcohol use disorder 10/18/2022   Tobacco use disorder 10/18/2022   Anemia 10/18/2022    Past Surgical History:  Procedure Laterality Date   BUNIONECTOMY Left    DILATION AND CURETTAGE OF UTERUS N/A 06/26/2022   Procedure: SUCTION DILATATION AND CURETTAGE;  Surgeon: Lazaro Arms, MD;  Location: [facility] OR;  Service: Gynecology;  Laterality: N/A;   DILATION AND EVACUATION N/A 07/12/2022   Procedure: DILATATION AND EVACUATION;  Surgeon: Reva Bores, MD;  Location: [facility] OR;  Service: Gynecology;  Laterality: N/A;    OB History     Gravida  9   Para  6   Term  6   Preterm  0   AB  3   Living  6      SAB  2   IAB  0   Ectopic  1   Multiple  0   Live Births  6            Home Medications    Prior to Admission medications   Medication Sig Start Date End Date Taking? Authorizing Provider  ondansetron (ZOFRAN-ODT)  4 MG disintegrating tablet Take 1 tablet (4 mg total) by mouth every 8 (eight) hours as needed. 04/25/23  Yes Tomi Bamberger, PA-C  mirtazapine (REMERON) 15 MG tablet Take 0.5 tablets (7.5 mg total) by mouth at bedtime. 04/25/23 06/24/23  [name], DO  sertraline (ZOLOFT) 50 MG tablet Take 3 tablets (150 mg total) by mouth daily. 04/25/23 06/24/23  [name], DO  traZODone (DESYREL) 50 MG tablet Take 50 mg by mouth at bedtime.    [provider]    Family History Family History  Family history unknown: Yes    Social History Social History   Tobacco Use   Smoking status: Some Days   Smokeless tobacco: Never   Tobacco comments:    06/25/22- "long time ago."    8/20204-black and mild weekly  Vaping Use   Vaping status: Never Used  Substance Use Topics   Alcohol use: Yes    Comment: sometimes,"I do not count how many at a time". Patient 's sister called triage at [facility] to report that patient had been drinking alot since she lost the baby.  On 10/18/2022 patient endorsed drinking bottle Tequila over the course of each weekend  Drug use: No     Allergies   Patient has no known allergies.   Review of Systems Review of Systems  Constitutional:  Negative for chills and fever.  Eyes:  Negative for discharge and redness.  Gastrointestinal:  Positive for nausea and vomiting. Negative for abdominal pain.  Genitourinary:  Positive for menstrual problem. Negative for vaginal bleeding.     Physical Exam Triage Vital Signs ED Triage Vitals  Encounter Vitals Group     BP      Systolic BP Percentile      Diastolic BP Percentile      Pulse      Resp      Temp      Temp src      SpO2      Weight      Height      Head Circumference      Peak Flow      Pain Score      Pain Loc      Pain Education      Exclude from Growth Chart    No data found.  Updated Vital Signs BP 126/82 (BP Location: Right Arm)   Pulse 74   Temp 99 F (37.2 C)   Resp 16   LMP  03/19/2023 (Approximate)   SpO2 98%     Physical Exam Vitals and nursing note reviewed.  Constitutional:      General: She is not in acute distress.    Appearance: Normal appearance. She is not ill-appearing.  HENT:     Head: Normocephalic and atraumatic.  Eyes:     Conjunctiva/sclera: Conjunctivae normal.  Cardiovascular:     Rate and Rhythm: Normal rate.  Pulmonary:     Effort: Pulmonary effort is normal. No respiratory distress.  Neurological:     Mental Status: She is alert.  Psychiatric:        Mood and Affect: Mood normal.        Behavior: Behavior normal.        Thought Content: Thought content normal.      UC Treatments / Results  Labs (all labs ordered are listed, but only abnormal results are displayed) Labs Reviewed  POCT URINE PREGNANCY    EKG   Radiology No results found.  Procedures Procedures (including critical care time)  Medications Ordered in UC Medications - No data to display  Initial Impression / Assessment and Plan / UC Course  I have reviewed the triage vital signs and the nursing notes.  Pertinent labs & imaging results that were available during my care of the patient were reviewed by me and considered in my medical decision making (see chart for details).    Pregnancy test negative. Advised to recheck preg test if she does not start her period over the next week and to follow up with any further concerns.   Final Clinical Impressions(s) / UC Diagnoses   Final diagnoses:  Nausea and vomiting, unspecified vomiting type   Discharge Instructions   None    ED Prescriptions     Medication Sig Dispense Auth. Provider   ondansetron (ZOFRAN-ODT) 4 MG disintegrating tablet Take 1 tablet (4 mg total) by mouth every 8 (eight) hours as needed. 20 tablet Tomi Bamberger, PA-C      PDMP not reviewed this encounter.   Tomi Bamberger, PA-C 04/25/23 [phone]

## 2023-04-25 NOTE — ED Triage Notes (Signed)
Pt reports that she had n/v eery day for past week. Is 4 days late for her menstrual cycle.

## 2023-04-26 ENCOUNTER — Inpatient Hospital Stay: Payer: 59 | Attending: Hematology and Oncology

## 2023-04-26 ENCOUNTER — Other Ambulatory Visit: Payer: Self-pay

## 2023-04-26 VITALS — BP 132/90 | HR 74 | Temp 98.6°F | Resp 16

## 2023-04-26 DIAGNOSIS — D509 Iron deficiency anemia, unspecified: Secondary | ICD-10-CM | POA: Diagnosis not present

## 2023-04-26 DIAGNOSIS — Z79899 Other long term (current) drug therapy: Secondary | ICD-10-CM | POA: Diagnosis not present

## 2023-04-26 MED ORDER — METHYLPREDNISOLONE SODIUM SUCC 125 MG IJ SOLR
125.0000 mg | Freq: Once | INTRAMUSCULAR | Status: AC | PRN
  Administered 2023-04-26: 125 mg via INTRAVENOUS

## 2023-04-26 MED ORDER — LORATADINE 10 MG PO TABS
10.0000 mg | ORAL_TABLET | Freq: Every day | ORAL | Status: DC
Start: 2023-04-26 — End: 2023-04-26

## 2023-04-26 MED ORDER — SODIUM CHLORIDE 0.9 % IV SOLN: Freq: Once | INTRAVENOUS | Status: DC | PRN

## 2023-04-26 MED ORDER — FAMOTIDINE 20 MG PO TABS
20.0000 mg | ORAL_TABLET | Freq: Once | ORAL | Status: AC
  Administered 2023-04-26: 20 mg via ORAL

## 2023-04-26 MED ORDER — FAMOTIDINE IN NACL 20-0.9 MG/50ML-% IV SOLN
20.0000 mg | Freq: Once | INTRAVENOUS | Status: AC | PRN
  Administered 2023-04-26: 20 mg via INTRAVENOUS

## 2023-04-26 MED ORDER — SODIUM CHLORIDE 0.9 % IV SOLN
300.0000 mg | Freq: Once | INTRAVENOUS | Status: AC
  Administered 2023-04-26: 300 mg via INTRAVENOUS
  Filled 2023-04-26: qty 300

## 2023-04-26 MED ORDER — CETIRIZINE HCL 10 MG PO TABS
10.0000 mg | ORAL_TABLET | Freq: Every day | ORAL | Status: DC
Start: 2023-04-26 — End: 2023-04-26
  Administered 2023-04-26: 10 mg via ORAL

## 2023-04-26 MED ORDER — SODIUM CHLORIDE 0.9 % IV SOLN: Freq: Once | INTRAVENOUS | Status: AC

## 2023-04-26 NOTE — Patient Instructions (Signed)
 Iron Sucrose Injection What is this medication? IRON SUCROSE (EYE ern SOO krose) treats low levels of iron (iron deficiency anemia) in people with kidney disease. Iron is a mineral that plays an important role in making red blood cells, which carry oxygen from your lungs to the rest of your body. This medicine may be used for other purposes; ask your health care provider or pharmacist if you have questions. COMMON BRAND NAME(S): Venofer What should I tell my care team before I take this medication? They need to know if you have any of these conditions: Anemia not caused by low iron levels Heart disease High levels of iron in the blood Kidney disease Liver disease An unusual or allergic reaction to iron, other medications, foods, dyes, or preservatives Pregnant or trying to get pregnant Breastfeeding How should I use this medication? This medication is for infusion into a vein. It is given in a hospital or clinic setting. Talk to your care team about the use of this medication in children. While this medication may be prescribed for children as young as 2 years for selected conditions, precautions do apply. Overdosage: If you think you have taken too much of this medicine contact a poison control center or emergency room at once. NOTE: This medicine is only for you. Do not share this medicine with others. What if I miss a dose? Keep appointments for follow-up doses. It is important not to miss your dose. Call your care team if you are unable to keep an appointment. What may interact with this medication? Do not take this medication with any of the following: Deferoxamine Dimercaprol Other iron products This medication may also interact with the following: Chloramphenicol Deferasirox This list may not describe all possible interactions. Give your health care provider a list of all the medicines, herbs, non-prescription drugs, or dietary supplements you use. Also tell them if you smoke,  drink alcohol, or use illegal drugs. Some items may interact with your medicine. What should I watch for while using this medication? Visit your care team regularly. Tell your care team if your symptoms do not start to get better or if they get worse. You may need blood work done while you are taking this medication. You may need to follow a special diet. Talk to your care team. Foods that contain iron include: whole grains/cereals, dried fruits, beans, or peas, leafy green vegetables, and organ meats (liver, kidney). What side effects may I notice from receiving this medication? Side effects that you should report to your care team as soon as possible: Allergic reactions--skin rash, itching, hives, swelling of the face, lips, tongue, or throat Low blood pressure--dizziness, feeling faint or lightheaded, blurry vision Shortness of breath Side effects that usually do not require medical attention (report to your care team if they continue or are bothersome): Flushing Headache Joint pain Muscle pain Nausea Pain, redness, or irritation at injection site This list may not describe all possible side effects. Call your doctor for medical advice about side effects. You may report side effects to FDA at 1-800-FDA-1088. Where should I keep my medication? This medication is given in a hospital or clinic. It will not be stored at home. NOTE: This sheet is a summary. It may not cover all possible information. If you have questions about this medicine, talk to your doctor, pharmacist, or health care provider.  2024 Elsevier/Gold Standard (2023-02-08 00:00:00)

## 2023-04-26 NOTE — Progress Notes (Signed)
Hypersensitivity Reaction note  Date of event: 04/26/23 Time of event: 1740 Generic name of drug involved: venofer 300mg  Name of provider notified of the hypersensitivity reaction: Karie Fetch, PA Was agent that likely caused hypersensitivity reaction added to Allergies List within EMR? yes Chain of events including reaction signs/symptoms, treatment administered, and outcome (e.g., drug resumed; drug discontinued; sent to Emergency Department; etc.) Pt completed first Venofer 300mg . Pt observed for 30 min post infusion. VSS and discharged in stable condition. Pt reported that she began to itch on right extremeties and returned to infusion room. Hives noted on right arm and lower right leg. Jae Dire, PA notified and PO med orders given. Pt reported feeling "hot" but without diaphoresis. Hives spread to bilateral extremities and then to face/neck. Pt noted she was breathing fine. VSS. IV re-started and IVF, IV pepcid and IV solumedrol given per reaction guidelines. Pt discharged in stable condition with education given about going to the ED or calling 911 with a return of symptoms or difficulty breathing.  [place], RN 04/26/2023 5:53 PM

## 2023-04-29 ENCOUNTER — Other Ambulatory Visit: Payer: Self-pay | Admitting: Hematology and Oncology

## 2023-04-29 NOTE — Progress Notes (Signed)
Because of her reaction to iron sucrose with the first treatment, I am adding methylprednisone and Pepcid

## 2023-05-03 ENCOUNTER — Inpatient Hospital Stay: Payer: 59

## 2023-05-09 ENCOUNTER — Other Ambulatory Visit (HOSPITAL_COMMUNITY): Payer: Self-pay | Admitting: Student

## 2023-05-09 DIAGNOSIS — F332 Major depressive disorder, recurrent severe without psychotic features: Secondary | ICD-10-CM

## 2023-05-09 DIAGNOSIS — F410 Panic disorder [episodic paroxysmal anxiety] without agoraphobia: Secondary | ICD-10-CM

## 2023-05-10 ENCOUNTER — Telehealth: Payer: Self-pay | Admitting: *Deleted

## 2023-05-10 ENCOUNTER — Inpatient Hospital Stay: Payer: 59

## 2023-05-10 NOTE — Progress Notes (Signed)
Pt no-show for inf appt 05/10/2023- scheduling message sent to reschedule patient for iron infusion.

## 2023-05-10 NOTE — Telephone Encounter (Signed)
Attempting to connect with [name] regarding 04/26/2023 staff message of her needing FMLA paperwork not received to date. "Have been out of work.  My doctor told me to have you all complete the forms and not to return to work until I complete treatments.  I don't know her name, it should be in my records.  Have not worked this month and only received one treatment.  Was not aware of an appointment today.  No one called me.  Do9 not use MyChart, need a new password.  Let me find what was emailed to me, I don't know how to get this to you.  Oh, It's in my G-mail account."    This nurse reviewed forms process, provided phone, fax and e-mail information.  No form yet received.  Emailed Authorization and cover sheet to [email or web address] which she confirmed having acces to,  Notice received reads "Delivery to these recipients or groups is complete."

## 2023-05-22 ENCOUNTER — Telehealth: Payer: Self-pay | Admitting: Hematology and Oncology

## 2023-05-22 NOTE — Telephone Encounter (Signed)
Scheduled appointments per staff message. Patient is aware of the made appointments, time and dates.

## 2023-05-29 ENCOUNTER — Inpatient Hospital Stay (HOSPITAL_BASED_OUTPATIENT_CLINIC_OR_DEPARTMENT_OTHER): Payer: 59 | Admitting: Physician Assistant

## 2023-05-29 ENCOUNTER — Other Ambulatory Visit: Payer: Self-pay | Admitting: Hematology and Oncology

## 2023-05-29 ENCOUNTER — Inpatient Hospital Stay: Payer: 59 | Attending: Hematology and Oncology

## 2023-05-29 VITALS — BP 132/89 | HR 68 | Temp 98.3°F | Resp 16

## 2023-05-29 DIAGNOSIS — T7840XD Allergy, unspecified, subsequent encounter: Secondary | ICD-10-CM

## 2023-05-29 DIAGNOSIS — D509 Iron deficiency anemia, unspecified: Secondary | ICD-10-CM | POA: Insufficient documentation

## 2023-05-29 MED ORDER — SODIUM CHLORIDE 0.9 % IV SOLN: Freq: Once | INTRAVENOUS | Status: AC

## 2023-05-29 MED ORDER — ACETAMINOPHEN 325 MG PO TABS
650.0000 mg | ORAL_TABLET | Freq: Once | ORAL | Status: AC
  Administered 2023-05-29: 650 mg via ORAL
  Filled 2023-05-29: qty 2

## 2023-05-29 MED ORDER — METHYLPREDNISOLONE SODIUM SUCC 125 MG IJ SOLR
125.0000 mg | Freq: Every day | INTRAMUSCULAR | Status: DC
  Administered 2023-05-29: 125 mg via INTRAVENOUS
  Filled 2023-05-29: qty 2

## 2023-05-29 MED ORDER — FAMOTIDINE IN NACL 20-0.9 MG/50ML-% IV SOLN
20.0000 mg | Freq: Once | INTRAVENOUS | Status: AC | PRN
  Administered 2023-05-29: 20 mg via INTRAVENOUS

## 2023-05-29 MED ORDER — METHYLPREDNISOLONE SODIUM SUCC 125 MG IJ SOLR
125.0000 mg | Freq: Once | INTRAMUSCULAR | Status: AC
  Administered 2023-05-29: 62.5 mg via INTRAVENOUS

## 2023-05-29 MED ORDER — SODIUM CHLORIDE 0.9 % IV SOLN
300.0000 mg | Freq: Once | INTRAVENOUS | Status: AC
  Administered 2023-05-29: 300 mg via INTRAVENOUS
  Filled 2023-05-29: qty 300

## 2023-05-29 MED ORDER — FAMOTIDINE IN NACL 20-0.9 MG/50ML-% IV SOLN
20.0000 mg | Freq: Once | INTRAVENOUS | Status: AC
  Administered 2023-05-29: 20 mg via INTRAVENOUS
  Filled 2023-05-29: qty 50

## 2023-05-29 MED ORDER — CETIRIZINE HCL 10 MG/ML IV SOLN
10.0000 mg | Freq: Once | INTRAVENOUS | Status: AC
  Administered 2023-05-29: 10 mg via INTRAVENOUS
  Filled 2023-05-29: qty 1

## 2023-05-29 MED ORDER — METHYLPREDNISOLONE 4 MG PO TBPK: ORAL_TABLET | ORAL | 0 refills | Status: DC

## 2023-05-29 MED ORDER — SODIUM CHLORIDE 0.9 % IV SOLN: Freq: Once | INTRAVENOUS | Status: DC | PRN

## 2023-05-29 NOTE — Progress Notes (Signed)
Because of serious adverse reaction to venofer, we will discontinue it and try to gove 1 dose of feraheme at her next week treatment

## 2023-05-29 NOTE — Progress Notes (Addendum)
Hypersensitivity Reaction note  Date of event: 05/29/23 Time of event: 1203 Generic name of drug involved: Venofer 300mg  Name of provider notified of the hypersensitivity reaction: Karie Fetch, PA Was agent that likely caused hypersensitivity reaction added to Allergies List within EMR? Yes Chain of events including reaction signs/symptoms, treatment administered, and outcome (e.g., drug resumed; drug discontinued; sent to Emergency Department; etc.)   At start of 30 min post 300mg  Venofer observation period, pt started to complain of BLE leg "burning" 8/10. and left arm "numbness". Per pt, this is similar to previous reaction to Venofer. IVF open to gravity, 20 mg Pepcid given, 10 mg cetrizine given. Mild improvement in symptoms per pt. 650mg  Tylenol given.  At 1243, pt states that symptoms have returned, 8/10 BLE burning, but in addition, BUE hives noted. 62.5mg  Solumedrol given.  1251 pt endorses chest/abdominal pain. VSS (see flowsheets). Jae Dire, [place] & Pamelia Hoit, MD notified. Additional monitoring period advised. Pt advised continued ED monitoring but declined. Steroid dose pack prescription sent to primary pharmacy. Pt aware.  Pt observed in infusion until 1350. Pt states chest pain is better, BLE pain 2/10, hives to BUE unchanged. At that time, pt was d/c via wheelchair to car with assistance from RN.   VSS throughout. See Flowsheets.   Plan to change Iron treatment plan to Feraheme per Pamelia Hoit, MD.   Joella Prince, RN 05/29/2023 12:17 PM

## 2023-05-29 NOTE — Progress Notes (Signed)
DATE:  05/29/23                                            X INFUSION REACTION     MD: Pamelia Hoit   AGENT/BLOOD PRODUCT RECEIVING TODAY:              Venofer 300   AGENT/BLOOD PRODUCT RECEIVING IMMEDIATELY PRIOR TO REACTION:          Venofer 300   VS: BP:     138/85   P:       69       SPO2:       100%                BP:     157/86   P:       65       SPO2:       100 %     REACTION(S):           leg pain, hives, chest pain   PREMEDS:     125 mg IV solu-medrol, 20 mg Pepcid   INTERVENTION: Pepcid 20 mg IV, Cetirizine 10 mg IV, tylenol 650 mg PO, solu-medrol 62 mg IV   Review of Systems  Review of Systems  Cardiovascular:  Positive for chest pain.  Musculoskeletal:  Positive for myalgias.  Skin:  Positive for rash.  All other systems reviewed and are negative.    Physical Exam  Physical Exam Vitals and nursing note reviewed.  Constitutional:      Appearance: She is not ill-appearing or toxic-appearing.     Comments: Airway patent  HENT:     Head: Normocephalic.  Eyes:     Conjunctiva/sclera: Conjunctivae normal.  Cardiovascular:     Rate and Rhythm: Normal rate and regular rhythm.     Pulses: Normal pulses.     Heart sounds: Normal heart sounds.  Pulmonary:     Effort: Pulmonary effort is normal.     Breath sounds: Normal breath sounds.  Abdominal:     General: There is no distension.  Musculoskeletal:     Cervical back: Normal range of motion.     Right lower leg: No edema.     Left lower leg: No edema.  Skin:    General: Skin is warm and dry.     Capillary Refill: Capillary refill takes less than 2 seconds.     Findings: Rash present. Rash is urticarial (BUE).  Neurological:     Mental Status: She is alert.     OUTCOME:                Patient received second infusion of Venofer 300 mg today. She had reaction after first transfusion and pre medications were added to treatment plan. During post infusion observation period patient experienced intense  burning pain in bilateral lower extremities, similar to previous reaction. Symptoms progressed to include chest pain and hives on BUE. Patient monitored and symptoms began to improve. She was offered ED evaluation for ongoing observation however prefers to discharge home. MD made aware and agrees with plan for medrol dosepak to start tomorrow as well as benadryl and pepcid. Discussed strict ED precautions with patient. Plan to switch to feraheme as a result of severe reaction.   I have spent a total of 30 minutes minutes of face-to-face and non-face-to-face time preparing to  see the patient, performing a medically appropriate examination, counseling and educating the patient, ordering medications, documenting clinical information in the electronic health record, and care coordination.

## 2023-05-29 NOTE — Patient Instructions (Signed)
Iron Sucrose Injection What is this medication? IRON SUCROSE (EYE ern SOO krose) treats low levels of iron (iron deficiency anemia) in people with kidney disease. Iron is a mineral that plays an important role in making red blood cells, which carry oxygen from your lungs to the rest of your body. This medicine may be used for other purposes; ask your health care provider or pharmacist if you have questions. COMMON BRAND NAME(S): Venofer What should I tell my care team before I take this medication? They need to know if you have any of these conditions: Anemia not caused by low iron levels Heart disease High levels of iron in the blood Kidney disease Liver disease An unusual or allergic reaction to iron, other medications, foods, dyes, or preservatives Pregnant or trying to get pregnant Breastfeeding How should I use this medication? This medication is for infusion into a vein. It is given in a hospital or clinic setting. Talk to your care team about the use of this medication in children. While this medication may be prescribed for children as young as 2 years for selected conditions, precautions do apply. Overdosage: If you think you have taken too much of this medicine contact a poison control center or emergency room at once. NOTE: This medicine is only for you. Do not share this medicine with others. What if I miss a dose? Keep appointments for follow-up doses. It is important not to miss your dose. Call your care team if you are unable to keep an appointment. What may interact with this medication? Do not take this medication with any of the following: Deferoxamine Dimercaprol Other iron products This medication may also interact with the following: Chloramphenicol Deferasirox This list may not describe all possible interactions. Give your health care provider a list of all the medicines, herbs, non-prescription drugs, or dietary supplements you use. Also tell them if you smoke,  drink alcohol, or use illegal drugs. Some items may interact with your medicine. What should I watch for while using this medication? Visit your care team regularly. Tell your care team if your symptoms do not start to get better or if they get worse. You may need blood work done while you are taking this medication. You may need to follow a special diet. Talk to your care team. Foods that contain iron include: whole grains/cereals, dried fruits, beans, or peas, leafy green vegetables, and organ meats (liver, kidney). What side effects may I notice from receiving this medication? Side effects that you should report to your care team as soon as possible: Allergic reactions--skin rash, itching, hives, swelling of the face, lips, tongue, or throat Low blood pressure--dizziness, feeling faint or lightheaded, blurry vision Shortness of breath Side effects that usually do not require medical attention (report to your care team if they continue or are bothersome): Flushing Headache Joint pain Muscle pain Nausea Pain, redness, or irritation at injection site This list may not describe all possible side effects. Call your doctor for medical advice about side effects. You may report side effects to FDA at 1-800-FDA-1088. Where should I keep my medication? This medication is given in a hospital or clinic. It will not be stored at home. NOTE: This sheet is a summary. It may not cover all possible information. If you have questions about this medicine, talk to your doctor, pharmacist, or health care provider.  2024 [employer]/Gold Standard (2023-02-08 00:00:00)

## 2023-06-04 ENCOUNTER — Encounter (HOSPITAL_COMMUNITY): Payer: Self-pay | Admitting: Student

## 2023-06-04 ENCOUNTER — Ambulatory Visit (INDEPENDENT_AMBULATORY_CARE_PROVIDER_SITE_OTHER): Payer: 59 | Admitting: Student

## 2023-06-04 DIAGNOSIS — F332 Major depressive disorder, recurrent severe without psychotic features: Secondary | ICD-10-CM | POA: Diagnosis not present

## 2023-06-04 DIAGNOSIS — F411 Generalized anxiety disorder: Secondary | ICD-10-CM

## 2023-06-04 DIAGNOSIS — F410 Panic disorder [episodic paroxysmal anxiety] without agoraphobia: Secondary | ICD-10-CM

## 2023-06-04 DIAGNOSIS — F1090 Alcohol use, unspecified, uncomplicated: Secondary | ICD-10-CM | POA: Diagnosis not present

## 2023-06-04 DIAGNOSIS — F17200 Nicotine dependence, unspecified, uncomplicated: Secondary | ICD-10-CM

## 2023-06-04 MED ORDER — GABAPENTIN 300 MG PO CAPS: 300.0000 mg | ORAL_CAPSULE | Freq: Every day | ORAL | 1 refills | Status: DC

## 2023-06-04 MED ORDER — MIRTAZAPINE 7.5 MG PO TABS: 7.5000 mg | ORAL_TABLET | Freq: Every day | ORAL | 1 refills | Status: DC

## 2023-06-04 MED ORDER — SERTRALINE HCL 50 MG PO TABS: 150.0000 mg | ORAL_TABLET | Freq: Every morning | ORAL | 1 refills | Status: DC

## 2023-06-04 NOTE — Progress Notes (Cosign Needed)
BH MD Outpatient Progress Note  06/04/2023 6:49 PM Lindsey Hunt  MRN: [id number]  Assessment:  Lindsey Hunt presents for follow-up evaluation in-person. Today, 06/04/23, patient reports that none of medications are working, still having mood swings, not sleeping.  Identifying Information: Lindsey Hunt is a 41 y.o. female with a history of MDD, GAD w panic attacks, AUD, tobacco use d/o in sustained remission, cluster B traits who is an established patient with [facility] for management of mood lability.   Risk Assessment: An assessment of suicide and violence risk factors was performed as part of this evaluation and is not significantly changed from the last visit.             While future psychiatric events cannot be accurately predicted, the patient does not currently require acute inpatient psychiatric care and does not currently meet [facility] involuntary commitment criteria.          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 Several medications are not helpful, however I am not surprised because her hemoglobin is around 10, she will be getting iron infusions later this week.  Expect that her mood will be better after she is repleted. Also patient has been taking Zoloft in the night and has been experiencing sleep delays, where she is unable to fall asleep until early morning, and will sleep until noon the next day.  Will rule out other causes of mood, see labs below.   Minimal improvement in increased zoloft dose. Still irritable, low energy, and not sleeping due to restless leg. Will hold off on increasing zoloft, but will try to improve sleep by managing RLS per below. Interventions: Continued home Zoloft 150 mg  once daily (s2/1/24, i2/15/24, i8/04/2023) Continue home remeron 7.5 mg nightly Labs: CMP, vitamin D, TSH - did not obtain, instructed patient to schedule lab appointment Referral to outpatient counseling   # Generalized anxiety disorder with panic attacks Past medication trials:  Status of problem:  Gabapentin started for RSL per below. However if well tolerated, will consider PRN gabapentin during the day as well for anxiety and irritability. Interventions: Zoloft and Remeron per above   # Circadian sleep rhythm disorder, delayed type Past medication trials:  Status of problem: not improving Initially endorsed only 3-4 hours of sleep, however on further conversation, patient sleeps a total about 12 hours total (sleeps ~11 PM-3 AM, then awake until 5 AM-1 PM). Her low energy and likely from her anemia.  Her sleeping of about 3-4 hours at night is likely from the Remeron she takes at before bed.  Also possibly that she cannot sleep for the remainder of the night because of activating side effects of Zoloft. However if this continues to persist, considering recommending melatonin. Interventions: Remeron and changing time of medication per above Encouraged sleep hygiene  # Restless leg syndrome Past medication trials:  Status of problem:  2/2 IDA. Still not repleated due to sensitivity reaction at least iron infusion. Interfering with sleep and contributing to mood concerns and irritability. Will start her on gabapentin per below.  Interventions: STARTED gabapentin 300 mg qHS  # Alcohol use disorder (no sz, no DT) Past medication trials: n/a Status of problem:  Action No history of complicated withdrawal.  Started drinking again a few months  back.  Drinking about 3 shots of tequila about 3 times a week.  Discussed naltrexone, which she is amenable to starting.  However planning on getting baseline LFTs prior to starting. Had wine last thursday, last time all month of 05/2023. She is  doing well on cutting back Interventions: Labs per above Encouraged continued limiting EtOH  # Tobacco use disorder Past medication trials:  Status of problem: acute Lapsed on smoking a few months back as well.  Black and mild weekly.  Precontemplative. Interventions: Encouraged cessation   Health Maintenance PCP: Salvatore Decent, PA-C @ ? [facility]  # Iron-deficiency anemia Past medication trials:  Status of problem: acute Hb 10.1 (04/15/2023), iron study completed, showing iron deficiency anemia.  She will be getting IV iron later this week.  Suspect this will greatly improved her energy and mood. Had sensitivity reaction to iron infusion, they plan to use a different formula Interventions: Repletion per hem/onc  Return to care in: Future Appointments  Date Time Provider Department Center  06/14/2023  7:30 AM [facility] INFUSION [facility] None  07/15/2023  2:00 PM [facility] LAB [facility] None  07/17/2023 11:45 AM Serena Croissant, MD [facility] None  07/23/2023 10:00 AM [name], DO [facility] None    Patient was given contact information for behavioral health clinic and was instructed to call 911 for emergencies.    Patient and plan of care will be discussed with the Attending MD, Dr. Josephina Shih, who agrees with the above statement and plan.   Subjective:  Chief Complaint:  Chief Complaint  Patient presents with   Depression   Anxiety   Interval History:   Not going back to work until Oct. Stated that she needs for [place] to release info to her PCP. Discussed with her that she needs to talk to the front desk for ROI.  Stated that she is currently on FMLA, that was signed by PCP for mental health. Discussed with patient that she would need to talk to PCP about extending FMLA.  Declined IOP, feels overwhelmed, would like to think over it more.  Discussed that FMLA requires that there be a plan with programing, and since there is none currently, cannot sign FMLA.    Mood: "depressed, anxious, irritable" Low energy, just want to lay in bed, low motivation  Does have some EtOH craving. Stated that EtOH numbed her which helped with her ruminating thoughts.   Sleep: poor - 3 hrs/night, no issues falling asleep, but would wake up 3 hrs later. Then would have restless leg and would be tossing and turning all night.   Appetite: Poor, eats about 1 meal a day trying to eat more greens  EtOH: denied, 1 glass of wine all month Nicotine: black and mild once a week.  Cannabis: Denied Other substances: Denied  Patient amenable to med changes per above after discussing the risks, benefits, and side effects. Otherwise patient had no other questions or concerns and was amenable to plan per above.  Safety: Denied active and passive SI, HI, AVH, paranoia. Patient is aware of [facility], 988 and 911 as well. Denied access to guns or weapons.  Review of Systems  Constitutional:  Positive for fatigue.  Respiratory:  Negative for shortness of breath.   Cardiovascular:  Negative for chest pain.  Gastrointestinal:  Negative for abdominal pain, constipation and diarrhea.  Neurological:  Negative for dizziness and light-headedness.   Visit Diagnosis:    ICD-10-CM   1. Tobacco use disorder  F17.200  2. Major depressive disorder, recurrent severe without psychotic features (HCC)  F33.2 sertraline (ZOLOFT) 50 MG tablet    mirtazapine (REMERON) 7.5 MG tablet    3. Generalized anxiety disorder with panic attacks  F41.1 sertraline (ZOLOFT) 50 MG tablet   F41.0 mirtazapine (REMERON) 7.5 MG tablet    4. Alcohol use disorder  F10.90        Past Psychiatric History:  Diagnoses: MDD, GAD at 10, grief, alcohol use disorder, tobacco use disorder, cluster B traits, historical dx of bipolar d/o (however changed and removed due to no evidence of mania even on ssri, suspect substance induced mood d/o) Medication trials: only tried one but slowed her down and made it hard to  take care of her kids Previous psychiatrist/therapist: yes therapy Hospitalizations: none Suicide attempts: none SIB: none Hx of violence towards others: none Current access to guns: none Hx of abuse: none that she can remember but at age 83 wondered if something happened to her in childhood and dreams about it. Sees herself touching a non-specific man; consistently she is a teenager  Substance Use History: EtOH:  reports current alcohol use. Nicotine: Black and mild weekly Marijuana: Denied IV drug use: Denied Stimulants: Denied Opiates: Denied Sedative/hypnotics: Denied Hallucinogens: Denied DT: Denied Detox: Denied Residential: Denied Previous: Per above    Past Medical History: Dx:  has a past medical history of Anemia, Anxiety, Bipolar disorder (HCC), Depression, Ectopic pregnancy, Fibroids, Headache, and Retained products of conception after miscarriage (07/12/2022).  Head trauma: Denied Seizures: Denied Allergies: Venofer [iron sucrose]   Family Psychiatric History:  3 and 9 year old children with anger and emotional   Suicide: Sister attempted Homicide: Few uncles Psych hospitalization: littler sister BiPD: grandma SCZ/SCzA: Denied Substance use: Dad and mom   Social History:  Housing: Living with 3 children Income: Employed, recently Transport planner for mental health, signed by PCP Children: 3x Support: Mom Guns/Weapons: Denied Legal: Denied  Past Medical History:  Past Medical History:  Diagnosis Date   Anemia    Anxiety    Bipolar disorder (HCC)    Depression    Ectopic pregnancy    Fibroids    Headache    Retained products of conception after miscarriage 07/12/2022    Past Surgical History:  Procedure Laterality Date   BUNIONECTOMY Left    DILATION AND CURETTAGE OF UTERUS N/A 06/26/2022   Procedure: SUCTION DILATATION AND CURETTAGE;  Surgeon: Lazaro Arms, MD;  Location: [facility] OR;  Service: Gynecology;  Laterality: N/A;   DILATION AND EVACUATION N/A  07/12/2022   Procedure: DILATATION AND EVACUATION;  Surgeon: Reva Bores, MD;  Location: [facility] OR;  Service: Gynecology;  Laterality: N/A;   Family History:  Family History  Family history unknown: Yes   Social History   Socioeconomic History   Marital status: Single    Spouse name: Not on file   Number of children: Not on file   Years of education: Not on file   Highest education level: Not on file  Occupational History   Not on file  Tobacco Use   Smoking status: Some Days   Smokeless tobacco: Never   Tobacco comments:    06/25/22- "long time ago."    8/20204-black and mild weekly  Vaping Use   Vaping status: Never Used  Substance and Sexual Activity   Alcohol use: Yes    Comment: sometimes,"I do not count how many at a time". Patient 's sister called triage at [facility] to report that patient had  been drinking alot since she lost the baby.  On 10/18/2022 patient endorsed drinking bottle Tequila over the course of each weekend   Drug use: No   Sexual activity: Not Currently    Birth control/protection: None  Other Topics Concern   Not on file  Social History Narrative   Not on file   Social Determinants of Health   Financial Resource Strain: Not on file  Food Insecurity: Not on file  Transportation Needs: Not on file  Physical Activity: Not on file  Stress: Not on file  Social Connections: Unknown (01/16/2022)   [facility]   Social Network    Social Network: Not on file    Allergies:  Allergies  Allergen Reactions   Venofer [Iron Sucrose] Hives and Itching    Pt with hives and itching 45 min after completion of venofer 300mg     Current Medications: Current Outpatient Medications  Medication Sig Dispense Refill   gabapentin (NEURONTIN) 300 MG capsule Take 1 capsule (300 mg total) by mouth at bedtime. 30 capsule 1   methylPREDNISolone (MEDROL DOSEPAK) 4 MG TBPK tablet Take 6 pills by mouth day 1, 5 on day 2, 4 on day 3, 3 on  day 4, 2 on day 5, 1 on day 6 21 tablet 0   mirtazapine (REMERON) 7.5 MG tablet Take 1 tablet (7.5 mg total) by mouth at bedtime. 30 tablet 1   ondansetron (ZOFRAN-ODT) 4 MG disintegrating tablet Take 1 tablet (4 mg total) by mouth every 8 (eight) hours as needed. 20 tablet 0   sertraline (ZOLOFT) 50 MG tablet Take 3 tablets (150 mg total) by mouth every morning. 90 tablet 1   No current facility-administered medications for this visit.    Objective: Psychiatric Specialty Exam: unknown if currently breastfeeding.There is no height or weight on file to calculate BMI.  General Appearance: Casual, faily groomed  Eye Contact:  Good    Speech:  Clear, coherent, normal rate   Volume:  Normal   Mood:  "depressed, irritable, anxious"  Affect:  Appropriate, congruent, restricted  Thought Content: Logical, rumination  Suicidal Thoughts: Denied active and passive SI    Thought Process:  Coherent, goal-directed, linear   Orientation:  A&Ox4   Memory:  Immediate good  Judgment:  Fair   Insight:  Shallow  Concentration:  Attention and concentration good   Recall:  Good  Fund of Knowledge: Good  Language: Good, fluent  Psychomotor Activity: normal  Akathisia:  NA   AIMS (if indicated): NA   Assets:  Communication Skills Desire for Improvement Financial Resources/Insurance Housing Leisure Time Physical Health Social Support Talents/Skills Transportation Vocational/Educational  ADL's:  Intact  Cognition: WNL  Sleep:  poor    Physical Exam Vitals and nursing note reviewed.  Constitutional:      General: She is awake. She is not in acute distress.    Appearance: She is not ill-appearing or diaphoretic.  HENT:     Head: Normocephalic.  Pulmonary:     Effort: Pulmonary effort is normal. No respiratory distress.  Neurological:     General: No focal deficit present.     Mental Status: She is alert and oriented to person, place, and time.     Cranial Nerves: No cranial nerve deficit.      Sensory: No sensory deficit.      Metabolic Disorder Labs: No results found for: "HGBA1C", "MPG" No results found for: "PROLACTIN" No results found for: "CHOL", "TRIG", "HDL", "CHOLHDL", "VLDL", "LDLCALC"  No results found for: "TSH"  Therapeutic Level Labs: No results found for: "LITHIUM" No results found for: "VALPROATE" No results found for: "CBMZ"  Screenings: GAD-7    Flowsheet Row Office Visit from 08/02/2022 in [facility]  Total GAD-7 Score 17      PHQ2-9    Flowsheet Row Office Visit from 10/18/2022 in [facility] [place] Office Visit from 08/02/2022 in [facility] at [facility]  PHQ-2 Total Score 6 4  PHQ-9 Total Score 15 15      Flowsheet Row ED from 04/25/2023 in [facility] Office Visit from 10/18/2022 in [facility] [place] ED from 09/26/2022 in [facility] at [facility]  C-SSRS RISK CATEGORY No Risk No Risk No Risk       Patient/Guardian was advised Release of Information must be obtained prior to any record release in order to collaborate their care with an outside provider. Patient/Guardian was advised if they have not already done so to contact the registration department to sign all necessary forms in order for [place] to release information regarding their care.   Consent: Patient/Guardian gives verbal consent for treatment and assignment of benefits for services provided during this visit. Patient/Guardian expressed understanding and agreed to proceed.   [name], DO Psych Resident, PGY-3

## 2023-06-05 ENCOUNTER — Inpatient Hospital Stay: Payer: 59

## 2023-06-10 ENCOUNTER — Telehealth: Payer: Self-pay

## 2023-06-10 NOTE — Telephone Encounter (Signed)
Patient called to requested information about release of records for [employer], stating she had come into the office to sign release form last week but the company says they have not received any records. Informed patient that per HIM they received the request on 06/05/23 and status was placed as "on hold" per [employer]ima. Patient was also notified that FMLA documents were currently in progress. Gave patient the number to contact HIM if she had further questions regarding the status, informing her that the records releases were done by a different department that FMLA. Patient hung up before any additional information could be given.

## 2023-06-11 ENCOUNTER — Telehealth: Payer: Self-pay

## 2023-06-11 NOTE — Telephone Encounter (Signed)
Notified Patient of completion of ADA and FMLA forms. Fax transmission confirmation received. Copy of forms emailed to Patient as requested. No other needs or concerns voiced at this time.

## 2023-06-13 DIAGNOSIS — Y92410 Unspecified street and highway as the place of occurrence of the external cause: Secondary | ICD-10-CM | POA: Diagnosis not present

## 2023-06-13 DIAGNOSIS — S0083XA Contusion of other part of head, initial encounter: Secondary | ICD-10-CM | POA: Diagnosis not present

## 2023-06-13 DIAGNOSIS — S161XXA Strain of muscle, fascia and tendon at neck level, initial encounter: Secondary | ICD-10-CM | POA: Diagnosis not present

## 2023-06-13 DIAGNOSIS — T22011A Burn of unspecified degree of right forearm, initial encounter: Secondary | ICD-10-CM | POA: Diagnosis not present

## 2023-06-13 DIAGNOSIS — S0990XA Unspecified injury of head, initial encounter: Secondary | ICD-10-CM | POA: Diagnosis not present

## 2023-06-13 DIAGNOSIS — S199XXA Unspecified injury of neck, initial encounter: Secondary | ICD-10-CM | POA: Diagnosis not present

## 2023-06-13 DIAGNOSIS — I10 Essential (primary) hypertension: Secondary | ICD-10-CM | POA: Diagnosis not present

## 2023-06-13 DIAGNOSIS — R519 Headache, unspecified: Secondary | ICD-10-CM | POA: Diagnosis not present

## 2023-06-13 DIAGNOSIS — M542 Cervicalgia: Secondary | ICD-10-CM | POA: Diagnosis not present

## 2023-06-14 ENCOUNTER — Inpatient Hospital Stay: Payer: 59

## 2023-06-19 ENCOUNTER — Telehealth: Payer: Self-pay | Admitting: *Deleted

## 2023-06-19 NOTE — Telephone Encounter (Signed)
Successfully returned LOA Accommodation form indicating "The leave is for the treatment of a separate, unrelated condition".  Patients condition: Iron Deficiency Anemia of unspecified type.  Codes D64.9, D50.9.  Copy to [facility] H.I.M bin for items to be scanned.  No further actions performed or required by this nurse.re

## 2023-06-19 NOTE — Telephone Encounter (Signed)
FMLA completed by other forms nurse 06/11/2023 returned to office.  Today, this nurse retrieved fax from pick up mail bin of faxed items received by [facility] H.I.M. staff from [employer].  Claims specialist letter reads patient asked for FMLA for Depression / Anxiety.  Ask provider to confirm the leave is for treatment of Depression /Anxiety or a separate unrelated condition.  This nurse reviewed EMR noting PCP originated FMLA will not renew and Behavior Health unable to sign FMLA without a treatment plan or program which LINET BRASH declined on 06/04/2023 visit.  Note also reads low iron is likely contributing to symptoms.  Form to provider for review, signature and return to forms staff to return to specialist.

## 2023-06-20 ENCOUNTER — Ambulatory Visit
Admission: EM | Admit: 2023-06-20 | Discharge: 2023-06-20 | Disposition: A | Discharge status: Home / Self Care | Payer: 59 | Attending: Physician Assistant | Admitting: Physician Assistant

## 2023-06-20 DIAGNOSIS — R519 Headache, unspecified: Secondary | ICD-10-CM | POA: Diagnosis not present

## 2023-06-20 LAB — POCT URINE PREGNANCY: Preg Test, Ur: NEGATIVE
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
## 2023-06-20 NOTE — ED Notes (Signed)
Patient is being discharged from the Urgent Care and sent to the Emergency Department via Private Vehicle (Self) . Per Provider-R. Reggie Pile, patient is in need of higher level of care due to Higher acuity of care needed. Patient is aware and verbalizes understanding of plan of care.  Vitals:   06/20/23 1616  BP: 118/79  Pulse: 83  Resp: 18  Temp: 98.6 F (37 C)  SpO2: 97%

## 2023-07-11 ENCOUNTER — Other Ambulatory Visit: Payer: Self-pay | Admitting: *Deleted

## 2023-07-11 DIAGNOSIS — D509 Iron deficiency anemia, unspecified: Secondary | ICD-10-CM

## 2023-07-15 ENCOUNTER — Emergency Department (HOSPITAL_COMMUNITY)
Admission: EM | Admit: 2023-07-15 | Discharge: 2023-07-16 | Discharge status: Against Medical Advice | Payer: 59 | Attending: Emergency Medicine | Admitting: Emergency Medicine

## 2023-07-15 ENCOUNTER — Inpatient Hospital Stay: Payer: 59

## 2023-07-15 ENCOUNTER — Encounter (HOSPITAL_COMMUNITY): Payer: Self-pay | Admitting: Emergency Medicine

## 2023-07-15 ENCOUNTER — Other Ambulatory Visit: Payer: Self-pay

## 2023-07-15 DIAGNOSIS — D72819 Decreased white blood cell count, unspecified: Secondary | ICD-10-CM | POA: Insufficient documentation

## 2023-07-15 DIAGNOSIS — Z5321 Procedure and treatment not carried out due to patient leaving prior to being seen by health care provider: Secondary | ICD-10-CM | POA: Insufficient documentation

## 2023-07-15 DIAGNOSIS — D509 Iron deficiency anemia, unspecified: Secondary | ICD-10-CM | POA: Insufficient documentation

## 2023-07-15 DIAGNOSIS — R519 Headache, unspecified: Secondary | ICD-10-CM | POA: Insufficient documentation

## 2023-07-15 DIAGNOSIS — Y92410 Unspecified street and highway as the place of occurrence of the external cause: Secondary | ICD-10-CM | POA: Insufficient documentation

## 2023-07-15 LAB — CBC WITH DIFFERENTIAL (CANCER CENTER ONLY)
Abs Immature Granulocytes: 0.01 10*3/uL (ref 0.00–0.07)
Basophils Absolute: 0 10*3/uL (ref 0.0–0.1)
Basophils Relative: 1 %
Eosinophils Absolute: 0 10*3/uL (ref 0.0–0.5)
Eosinophils Relative: 1 %
HCT: 36.6 % (ref 36.0–46.0)
Hemoglobin: 11.8 g/dL — ABNORMAL LOW (ref 12.0–15.0)
Immature Granulocytes: 0 %
Lymphocytes Relative: 44 %
Lymphs Abs: 1.5 10*3/uL (ref 0.7–4.0)
MCH: 28.9 pg (ref 26.0–34.0)
MCHC: 32.2 g/dL (ref 30.0–36.0)
MCV: 89.7 fL (ref 80.0–100.0)
Monocytes Absolute: 0.3 10*3/uL (ref 0.1–1.0)
Monocytes Relative: 8 %
Neutro Abs: 1.5 10*3/uL — ABNORMAL LOW (ref 1.7–7.7)
Neutrophils Relative %: 46 %
Platelet Count: 337 10*3/uL (ref 150–400)
RBC: 4.08 MIL/uL (ref 3.87–5.11)
RDW: 16.5 % — ABNORMAL HIGH (ref 11.5–15.5)
WBC Count: 3.3 10*3/uL — ABNORMAL LOW (ref 4.0–10.5)
nRBC: 0 % (ref 0.0–0.2)

## 2023-07-15 LAB — IRON AND IRON BINDING CAPACITY (CC-WL,HP ONLY)
Iron: 285 ug/dL — ABNORMAL HIGH (ref 28–170)
Saturation Ratios: 78 % — ABNORMAL HIGH (ref 10.4–31.8)
TIBC: 365 ug/dL (ref 250–450)
UIBC: 80 ug/dL — ABNORMAL LOW (ref 148–442)

## 2023-07-15 LAB — FERRITIN: Ferritin: 20 ng/mL (ref 11–307)

## 2023-07-15 LAB — FOLATE: Folate: 6.2 ng/mL (ref >5.9)

## 2023-07-15 LAB — VITAMIN B12: Vitamin B-12: 357 pg/mL (ref 180–914)
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
## 2023-07-17 ENCOUNTER — Telehealth (HOSPITAL_COMMUNITY): Payer: Self-pay | Admitting: *Deleted

## 2023-07-17 ENCOUNTER — Inpatient Hospital Stay (HOSPITAL_BASED_OUTPATIENT_CLINIC_OR_DEPARTMENT_OTHER): Payer: 59 | Admitting: Hematology and Oncology

## 2023-07-17 DIAGNOSIS — D509 Iron deficiency anemia, unspecified: Secondary | ICD-10-CM

## 2023-07-17 NOTE — Assessment & Plan Note (Signed)
Iron deficiency anemia most likely related to heavy menstrual cycles  Prior treatment: IV Venofer 04/26/2023 (allergy reaction) Lab review: 04/15/2023: Ferritin 2, iron saturation 3%, TIBC 518, B12 375, folate 4.  Hemoglobin 10.1 07/15/2023: Ferritin 20, folate 6.2, B12 357, iron saturation 78%, hemoglobin 11.8  I discussed with the patient that she has had significant improvement in her iron levels and her hemoglobin. No indication for IV iron at this time.  Recheck labs in 3 months and telephone visit follow-up after that to discuss results.  If she were to need IV iron in the future we will have to obtain Feraheme.

## 2023-07-17 NOTE — Progress Notes (Signed)
HEMATOLOGY-ONCOLOGY TELEPHONE VISIT PROGRESS NOTE  I connected with our patient on 07/17/23 at 11:45 AM EDT by telephone and verified that I am speaking with the correct person using two identifiers.  I discussed the limitations, risks, security and privacy concerns of performing an evaluation and management service by telephone and the availability of in person appointments.  I also discussed with the patient that there may be a patient responsible charge related to this service. The patient expressed understanding and agreed to proceed.   History of Present Illness: History of Present Illness   The patient, with a history of anemia, recently received IV iron therapy. She experienced a significant adverse reaction to the IV iron, which has been noted in her medical record as an allergy. Despite this, her hemoglobin has improved from 10 to almost 12, and her iron stores have increased from 2 to 20. Her B12 and folic acid levels are within normal ranges. She has not started any new medications recently.  The patient also has a slightly low white blood cell count of 3.3 (normal range 4-10). She denies any recent infections or illnesses and has not started any new medications. She expresses concern about the potential impact of this on her energy levels and asks about ways to increase her white blood cell count.        Oncology History   No history exists.    REVIEW OF SYSTEMS:   Constitutional: Denies fevers, chills or abnormal weight loss All other systems were reviewed with the patient and are negative. Observations/Objective:     Assessment Plan:  Iron deficiency anemia Iron deficiency anemia most likely related to heavy menstrual cycles  Prior treatment: IV Venofer 04/26/2023 (allergy reaction) Lab review: 04/15/2023: Ferritin 2, iron saturation 3%, TIBC 518, B12 375, folate 4.  Hemoglobin 10.1 07/15/2023: Ferritin 20, folate 6.2, B12 357, iron saturation 78%, hemoglobin 11.8  I  discussed with the patient that she has had significant improvement in her iron levels and her hemoglobin. No indication for IV iron at this time.  Recheck labs in 3 months and telephone visit follow-up after that to discuss results.  If she were to need IV iron in the future we will have to obtain Feraheme. --------------------------------- Assessment and Plan    Iron Deficiency Anemia Significant improvement in hemoglobin and iron stores following IV iron therapy. Noted allergic reaction to IV iron. -Continue current management. -Plan to avoid the specific IV iron formulation used previously due to allergic reaction. -Check labs in three months to monitor hemoglobin and iron levels.  Leukopenia New finding of low white blood cell count (3.3). No recent infections or new medications reported. -Encourage intake of plant-based proteins for potential micronutrient benefits. -Recheck white blood cell count in three months to monitor trend.     I discussed the assessment and treatment plan with the patient. The patient was provided an opportunity to ask questions and all were answered. The patient agreed with the plan and demonstrated an understanding of the instructions. The patient was advised to call back or seek an in-person evaluation if the symptoms worsen or if the condition fails to improve as anticipated.   I provided 12 minutes of non-face-to-face time during this encounter.  This includes time for charting and coordination of care   Tamsen Meek, MD

## 2023-07-17 NOTE — Telephone Encounter (Signed)
Visit notes from 04/25/23 and 06/04/23 faxed to [employer] for pt disability claim. ROI signed by pt on 07/08/23 @ 1521.

## 2023-07-23 ENCOUNTER — Encounter (HOSPITAL_COMMUNITY): Payer: 59 | Admitting: Student

## 2023-08-07 ENCOUNTER — Encounter (HOSPITAL_COMMUNITY): Payer: 59 | Admitting: Student

## 2023-08-09 ENCOUNTER — Telehealth (HOSPITAL_COMMUNITY): Payer: Self-pay | Admitting: Licensed Clinical Social Worker

## 2023-08-09 NOTE — Telephone Encounter (Signed)
Erroneous encounter

## 2023-08-20 ENCOUNTER — Encounter (HOSPITAL_COMMUNITY): Payer: Self-pay | Admitting: Student

## 2023-08-20 ENCOUNTER — Other Ambulatory Visit: Payer: Self-pay

## 2023-08-20 ENCOUNTER — Ambulatory Visit (INDEPENDENT_AMBULATORY_CARE_PROVIDER_SITE_OTHER): Payer: 59 | Admitting: Student

## 2023-08-20 ENCOUNTER — Emergency Department (HOSPITAL_COMMUNITY): Payer: 59

## 2023-08-20 ENCOUNTER — Emergency Department (HOSPITAL_COMMUNITY)
Admission: EM | Admit: 2023-08-20 | Discharge: 2023-08-20 | Disposition: A | Discharge status: Home / Self Care | Payer: 59 | Attending: Emergency Medicine | Admitting: Emergency Medicine

## 2023-08-20 ENCOUNTER — Encounter (HOSPITAL_COMMUNITY): Payer: Self-pay

## 2023-08-20 VITALS — BP 133/92 | HR 92 | Wt 194.6 lb

## 2023-08-20 DIAGNOSIS — F410 Panic disorder [episodic paroxysmal anxiety] without agoraphobia: Secondary | ICD-10-CM | POA: Diagnosis not present

## 2023-08-20 DIAGNOSIS — H538 Other visual disturbances: Secondary | ICD-10-CM | POA: Insufficient documentation

## 2023-08-20 DIAGNOSIS — F1090 Alcohol use, unspecified, uncomplicated: Secondary | ICD-10-CM

## 2023-08-20 DIAGNOSIS — R42 Dizziness and giddiness: Secondary | ICD-10-CM | POA: Insufficient documentation

## 2023-08-20 DIAGNOSIS — G44309 Post-traumatic headache, unspecified, not intractable: Secondary | ICD-10-CM | POA: Diagnosis not present

## 2023-08-20 DIAGNOSIS — R0789 Other chest pain: Secondary | ICD-10-CM | POA: Insufficient documentation

## 2023-08-20 DIAGNOSIS — F332 Major depressive disorder, recurrent severe without psychotic features: Secondary | ICD-10-CM

## 2023-08-20 DIAGNOSIS — R519 Headache, unspecified: Secondary | ICD-10-CM | POA: Diagnosis not present

## 2023-08-20 DIAGNOSIS — F17200 Nicotine dependence, unspecified, uncomplicated: Secondary | ICD-10-CM | POA: Diagnosis not present

## 2023-08-20 DIAGNOSIS — F0781 Postconcussional syndrome: Secondary | ICD-10-CM | POA: Insufficient documentation

## 2023-08-20 DIAGNOSIS — F411 Generalized anxiety disorder: Secondary | ICD-10-CM | POA: Diagnosis not present

## 2023-08-20 LAB — CBC
HCT: 37.9 % (ref 36.0–46.0)
Hemoglobin: 12.1 g/dL (ref 12.0–15.0)
MCH: 29.1 pg (ref 26.0–34.0)
MCHC: 31.9 g/dL (ref 30.0–36.0)
MCV: 91.1 fL (ref 80.0–100.0)
Platelets: 380 10*3/uL (ref 150–400)
RBC: 4.16 MIL/uL (ref 3.87–5.11)
RDW: 14.2 % (ref 11.5–15.5)
WBC: 3.5 10*3/uL — ABNORMAL LOW (ref 4.0–10.5)
nRBC: 0 % (ref 0.0–0.2)

## 2023-08-20 LAB — BASIC METABOLIC PANEL
Anion gap: 6 (ref 5–15)
BUN: 9 mg/dL (ref 6–20)
CO2: 22 mmol/L (ref 22–32)
Calcium: 8.4 mg/dL — ABNORMAL LOW (ref 8.9–10.3)
Chloride: 108 mmol/L (ref 98–111)
Creatinine, Ser: 0.83 mg/dL (ref 0.44–1.00)
GFR, Estimated: >60 mL/min (ref >60)
Glucose, Bld: 100 mg/dL — ABNORMAL HIGH (ref 70–99)
Potassium: 3.8 mmol/L (ref 3.5–5.1)
Sodium: 136 mmol/L (ref 135–145)

## 2023-08-20 LAB — POC URINE PREG, ED: Preg Test, Ur: NEGATIVE

## 2023-08-20 LAB — HCG, SERUM, QUALITATIVE: Preg, Serum: NEGATIVE

## 2023-08-20 LAB — TROPONIN I (HIGH SENSITIVITY): Troponin I (High Sensitivity): <2 ng/L (ref <18)

## 2023-08-20 MED ORDER — MIRTAZAPINE 15 MG PO TABS: 15.0000 mg | ORAL_TABLET | Freq: Every day | ORAL | 0 refills | Status: DC

## 2023-08-20 MED ORDER — PROCHLORPERAZINE EDISYLATE 10 MG/2ML IJ SOLN
5.0000 mg | Freq: Once | INTRAMUSCULAR | Status: AC
  Administered 2023-08-20: 5 mg via INTRAVENOUS
  Filled 2023-08-20: qty 2

## 2023-08-20 MED ORDER — KETOROLAC TROMETHAMINE 30 MG/ML IJ SOLN
15.0000 mg | Freq: Once | INTRAMUSCULAR | Status: AC
  Administered 2023-08-20: 15 mg via INTRAVENOUS
  Filled 2023-08-20: qty 1

## 2023-08-20 MED ORDER — GABAPENTIN 300 MG PO CAPS
300.0000 mg | ORAL_CAPSULE | Freq: Two times a day (BID) | ORAL | 1 refills | Status: DC | PRN
Start: 2023-08-20 — End: 2023-10-02

## 2023-08-20 MED ORDER — PROCHLORPERAZINE MALEATE 10 MG PO TABS
5.0000 mg | ORAL_TABLET | Freq: Four times a day (QID) | ORAL | 0 refills | Status: AC | PRN
Start: 2023-08-20 — End: ?

## 2023-08-20 MED ORDER — DIPHENHYDRAMINE HCL 50 MG/ML IJ SOLN
25.0000 mg | Freq: Once | INTRAMUSCULAR | Status: AC
  Administered 2023-08-20: 25 mg via INTRAVENOUS
  Filled 2023-08-20: qty 1

## 2023-08-20 MED ORDER — NAPROXEN 375 MG PO TABS
375.0000 mg | ORAL_TABLET | Freq: Two times a day (BID) | ORAL | 0 refills | Status: AC | PRN
Start: 2023-08-20 — End: ?

## 2023-08-20 NOTE — Discharge Instructions (Addendum)
Our preg test is negative  Contact a health care provider if: Your symptoms do not improve. You get injured again. You have unusual behavior changes. Get help right away if: You have a severe or worsening headache. You have weakness or numbness in any part of your body. You vomit repeatedly. You have mental status changes, such as: Confusion. Trouble speaking. Trouble staying awake. Fainting. You have a seizure. These symptoms may be an emergency. Get help right away. Call 911. Do not wait to see if the symptoms will go away. Do not drive yourself to the hospital.
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
## 2023-08-20 NOTE — ED Triage Notes (Signed)
C/o centralized cp with headache and sob.  Denies radiation of cp, dizziness, sensitivity to light or sound.  Pt talking in full sentences.  Pt reports lmp October and requesting pregnancy test.

## 2023-08-22 ENCOUNTER — Ambulatory Visit (HOSPITAL_COMMUNITY): Payer: 59

## 2023-08-22 ENCOUNTER — Telehealth (HOSPITAL_COMMUNITY): Payer: Self-pay | Admitting: Professional

## 2023-08-22 ENCOUNTER — Telehealth (HOSPITAL_COMMUNITY): Payer: Self-pay | Admitting: Licensed Clinical Social Worker

## 2023-08-22 ENCOUNTER — Encounter (HOSPITAL_COMMUNITY): Payer: Self-pay

## 2023-08-22 NOTE — Telephone Encounter (Signed)
Cln called to reschedule PHP CCA. Pt stated she was on but no one else joined and was noticeably irritable. Per chart review, Milana Na signed onto Teams and attempted to call pt without success. Cln informed pt of same and pt denied having been called by anyone. Cln reminded pt of her being oriented to the [facility] assessment process by this cln and being sent the same email twice with instructions. Pt denied having received the emails and stated that she connected to [facility] because she got a text. Cln asked pt to clarify the type of treatment she is seeking, mental health or substance use, as there had been some confusion. Pt became more irritable and stated she was following the recommendation made by Dr. Cyndie Chime to seek "mental" before substance use. Cln offered to reschedule and pt discourteously agreed. Cln detailed instructions for how to connect and requested pt repeat instructions back. Pt stated, "I don't have Teams. I'll have to use my laptop." Cln advised her that she will have to do so because Teams is used for group. Cln sent pt email stating, "You are scheduled for an assessment for the PHP on Wednesday, 12/11 at 2:00 pm. This appointment will last approximately one hour and will be virtual via [employer] Please download the Teams app prior to the appointment. You will receive a link via email to join the meeting and will click on the link to connect. If your appointment is scheduled as a MyChart video visit, please do not join that way, as we need to ensure you are able to use Teams for group.  If you need to cancel or reschedule, please call [phone] and leave a voicemail with your name, date of birth, and phone number" which was the same email sent twice before but with today's appointment date and time. Cln requested pt confirm receipt while on the phone with cln. Pt confirmed. Cln re-oriented pt to PHP and pt expressed she would still like to proceed. Pt hung up on cln when cln asked  if she had any questions.

## 2023-08-26 ENCOUNTER — Encounter: Payer: Self-pay | Admitting: Hematology and Oncology

## 2023-08-28 ENCOUNTER — Ambulatory Visit (INDEPENDENT_AMBULATORY_CARE_PROVIDER_SITE_OTHER): Payer: 59 | Admitting: Licensed Clinical Social Worker

## 2023-08-28 ENCOUNTER — Telehealth (HOSPITAL_COMMUNITY): Payer: Self-pay | Admitting: Licensed Clinical Social Worker

## 2023-08-28 DIAGNOSIS — F1090 Alcohol use, unspecified, uncomplicated: Secondary | ICD-10-CM

## 2023-08-28 DIAGNOSIS — F332 Major depressive disorder, recurrent severe without psychotic features: Secondary | ICD-10-CM
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
## 2023-09-02 ENCOUNTER — Telehealth (HOSPITAL_COMMUNITY): Payer: Self-pay | Admitting: Licensed Clinical Social Worker

## 2023-09-02 NOTE — Telephone Encounter (Signed)
Cln called to f/u on PHP ppw. Pt states she forgot and asked cln when it was sent and by whom. Cln informed pt it was sent by this cln on 12/11 and reminded pt of instructions for opening secure emails and instructed her to send completed paperwork in a new email rather than replying. Cln advised pt that paperwork will need to be returned by 8 am in order for pt to start group tomorrow, otherwise her start date will have to be pushed. Pt verbalized understanding.

## 2023-09-03 ENCOUNTER — Ambulatory Visit (HOSPITAL_COMMUNITY): Payer: 59

## 2023-09-03 ENCOUNTER — Encounter (HOSPITAL_COMMUNITY): Payer: Self-pay

## 2023-09-04 ENCOUNTER — Ambulatory Visit (HOSPITAL_COMMUNITY): Payer: 59

## 2023-09-04 ENCOUNTER — Telehealth (HOSPITAL_COMMUNITY): Payer: Self-pay | Admitting: Licensed Clinical Social Worker

## 2023-09-04 ENCOUNTER — Encounter (HOSPITAL_COMMUNITY): Payer: Self-pay

## 2023-09-05 ENCOUNTER — Ambulatory Visit (HOSPITAL_COMMUNITY): Payer: 59

## 2023-09-05 NOTE — Telephone Encounter (Signed)
Cln sent pt the following email:  Hi Cherrise, You were scheduled to begin PHP on 12/17. Due to not receiving your completed paperwork and being unable to find a solution on how to accomplish this via phone, we have discharged you from [facility]. If you would still like to join PHP now, or in the future, please reach out to [place] at [phone] and we can discuss how we can help you, including with the paperwork. Please be aware that another assessment appointment will be required if it is more than 30 days from your last assessment.  I emailed you a list of in-network therapists on 12/11 and have provided that list in this email. I do encourage you to reach out to these providers to be scheduled for individual therapy and/or medication management.  Again, please reach out to the [facility] team at [phone] if we can further assist you with the process of joining PHP. Please do not contact the [facility]'s front office regarding this, as they are unable to assist with PHP scheduling and will most likely be unable to provide answers about PHP.  [facility] [address] [place], [place] [address] [phone] [email or web address]  [facility] [address] [place], [place] [address] [phone] [email or web address]  [facility] [phone] [address] [place], [place] [address]?   [facility] [address] [place], [place] [address] [phone]

## 2023-09-05 NOTE — Telephone Encounter (Signed)
Cln called to f/u on PHP paperwork. Pt stated she does not understand the paperwork and expressed irritation with cln for not telling her how much paperwork there would be. Pt states she does not know how to complete the paperwork and stated she does not have the information the paperwork is asking for. Cln attempted to explain the paperwork again, as cln went over the paperwork at the end of pt's assessment and some of the forms were explained in the email sent to pt, such as the ROI for an emergency contact and the ROI for FMLA. Pt interrupted cln and said, "It's all over email. I have to get a ride to go down there and print it out. Why do I need an emergency contact? I don't have one." Of note, at the end of pt's CCA pt was made aware of the paperwork being hard copy and was given the option of picking up the paperwork at one of the [facility] outpatient offices or to print it at a location of her choosing, and pt elected to print the paperwork herself. While cln was explaining why an emergency contact is required for PHP, pt hung up.

## 2023-09-06 ENCOUNTER — Encounter (HOSPITAL_COMMUNITY): Payer: Self-pay

## 2023-09-06 ENCOUNTER — Ambulatory Visit (HOSPITAL_COMMUNITY): Payer: 59

## 2023-09-06 ENCOUNTER — Telehealth (HOSPITAL_COMMUNITY): Payer: Self-pay | Admitting: *Deleted

## 2023-09-06 NOTE — Telephone Encounter (Signed)
Able to get in contact with patient, who was wondering if she can still take her gabapentin if she doesn't wake up in the morning.   Reassured her that she can, and to allow at least ~8hrs before the next gabapentin dose.  She inquired about PHP/IOP and labs and FMLA papers and that she was having a hard time keeping track and was feeling overwhelmed.   Educated her on the process of PHP/IOP and that the office will call her to schedule blood draw appointments.   She has no other questions or concerns at this time.

## 2023-09-06 NOTE — Telephone Encounter (Signed)
Patient asking for a return call to discuss her medication and also her disability paperwork. She is confused about how to take her medication and what is for alcohol addiction. [phone].

## 2023-09-09 ENCOUNTER — Ambulatory Visit (HOSPITAL_COMMUNITY): Payer: 59

## 2023-09-12 ENCOUNTER — Ambulatory Visit (HOSPITAL_COMMUNITY): Payer: 59

## 2023-09-12 ENCOUNTER — Other Ambulatory Visit (HOSPITAL_COMMUNITY): Payer: Self-pay | Admitting: Student

## 2023-09-12 DIAGNOSIS — F332 Major depressive disorder, recurrent severe without psychotic features: Secondary | ICD-10-CM

## 2023-09-12 DIAGNOSIS — F410 Panic disorder [episodic paroxysmal anxiety] without agoraphobia: Secondary | ICD-10-CM

## 2023-09-13 ENCOUNTER — Ambulatory Visit (HOSPITAL_COMMUNITY): Payer: 59

## 2023-09-16 ENCOUNTER — Ambulatory Visit (HOSPITAL_COMMUNITY): Payer: 59

## 2023-09-17 ENCOUNTER — Ambulatory Visit (HOSPITAL_COMMUNITY): Payer: 59

## 2023-09-19 ENCOUNTER — Ambulatory Visit (HOSPITAL_COMMUNITY): Payer: 59

## 2023-09-20 ENCOUNTER — Ambulatory Visit (HOSPITAL_COMMUNITY): Payer: 59

## 2023-09-23 ENCOUNTER — Ambulatory Visit (HOSPITAL_COMMUNITY): Payer: 59

## 2023-09-24 ENCOUNTER — Ambulatory Visit (HOSPITAL_COMMUNITY): Payer: 59

## 2023-09-25 ENCOUNTER — Ambulatory Visit (HOSPITAL_COMMUNITY): Payer: 59

## 2023-10-02 ENCOUNTER — Encounter (HOSPITAL_COMMUNITY): Payer: Self-pay | Admitting: Student

## 2023-10-02 ENCOUNTER — Ambulatory Visit (HOSPITAL_BASED_OUTPATIENT_CLINIC_OR_DEPARTMENT_OTHER): Payer: 59 | Admitting: Student

## 2023-10-02 ENCOUNTER — Other Ambulatory Visit (HOSPITAL_COMMUNITY): Payer: Self-pay

## 2023-10-02 VITALS — BP 129/85 | HR 71 | Ht 66.73 in | Wt 200.2 lb

## 2023-10-02 DIAGNOSIS — F410 Panic disorder [episodic paroxysmal anxiety] without agoraphobia: Secondary | ICD-10-CM | POA: Diagnosis not present

## 2023-10-02 DIAGNOSIS — F332 Major depressive disorder, recurrent severe without psychotic features: Secondary | ICD-10-CM

## 2023-10-02 DIAGNOSIS — F1090 Alcohol use, unspecified, uncomplicated: Secondary | ICD-10-CM | POA: Diagnosis not present

## 2023-10-02 DIAGNOSIS — Z79899 Other long term (current) drug therapy: Secondary | ICD-10-CM

## 2023-10-02 DIAGNOSIS — F411 Generalized anxiety disorder: Secondary | ICD-10-CM

## 2023-10-02 MED ORDER — MIRTAZAPINE 30 MG PO TABS: 30.0000 mg | ORAL_TABLET | Freq: Every day | ORAL | 1 refills | Status: DC

## 2023-10-02 MED ORDER — GABAPENTIN 300 MG PO CAPS: 300.0000 mg | ORAL_CAPSULE | Freq: Two times a day (BID) | ORAL | 1 refills | Status: DC | PRN

## 2023-10-02 NOTE — Progress Notes (Signed)
BH MD Outpatient Progress Note  10/02/2023 4:51 PM Lindsey Hunt  MRN: [id number]  Assessment:  [name] presents for follow-up evaluation in-person.   Identifying Information: Lindsey Hunt is a 43 y.o. female with a history of MDD, GAD w panic attacks, AUD, tobacco use d/o in sustained remission, cluster B traits who is an established patient with [facility] for management of mood lability.   Risk Assessment: An assessment of suicide and violence risk factors was performed as part of this evaluation and is not significantly changed from the last visit.             While future psychiatric events cannot be accurately predicted, the patient does not currently require acute inpatient psychiatric care and does not currently meet [facility] involuntary commitment criteria.          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 No longer on ssri. She takes ssri, remeron, gabapentin about once weekly as needed. When she was on zoloft consistently, did find that it helped with irritability, but was still having significant ruminating thoughts, so she stopped it, also because of perception that she had to take it in the morning. Feels overwhelmed with taking rx in the AM, prefers all PM. So increase remeron per below. Considered prozac because of rx non-adherence, but she does not want to take anything in the AM. No change in depression and anxiety, aside from less irritability. Appears melancholic.  INCREASED home remeron 15 mg to 30 mg  at bedtime (i1/16/2025) Labs: CBC, CMP, vitamin D, TSH - collected Starting [facility] 08/22/2023   # Generalized anxiety disorder with panic attacks Past medication trials: see  above Status of problem:  Less irritability, otherwise no change Interventions: Remeron per above  # Alcohol use disorder (no sz, no DT) Past medication trials: gabapentin Status of problem:  Contemplative stage. No history of complicated withdrawal. Discussed naltrexone, which she is amenable to starting. However planning on getting baseline LFTs prior to starting. Started drinking near daily again since 06/2023, about 5 shots a night to help with negative and ruminating thoughts. Does want to cut back, motivated because she started noticing health concerns, which she was instructed to see PCP for workup. Interventions: Possibly starting nalrexone if transaminases permits  Encouraged continued limiting EtOH Continued gabapentin 300 mg BID PRN  # Restless leg syndrome Past medication trials: gabapentin Status of problem:  2/2 IDA. Repleated, improved and alleviated with gabapentin Interventions: Gabapentin per above  # Tobacco use disorder, early remission (06/2023) Past medication trials:  Status of problem:  Was smoking black and mild weekly Interventions: Encouraged cessation   Health Maintenance PCP: Salvatore Decent, PA-C @ ? [facility]  IDA - repletion per hem/onc   Return to care in: Future Appointments  Date Time Provider Department Center  10/07/2023 10:45 AM Huston Foley, MD [facility] None  10/14/2023  2:00 PM [facility] None  10/15/2023  1:00 PM Nolon Stalls, LCSW [facility] None  10/21/2023  8:15 AM Serena Croissant, MD [facility] None  11/06/2023  1:00 PM [name], DO [facility] None    Patient was given contact information for behavioral health clinic and was instructed to call 911 for emergencies.    Patient and plan of care will  be discussed with the Attending MD, who agrees with the above statement and plan.   Subjective:  Chief Complaint:  Chief Complaint  Patient presents with   Anxiety   Depression   Alcohol Problem   Interval  History:   PDMP - 08/20/2023 gabapentin 300 mg 30 QTY for 15 days  Unaccompanied.  About to get evicted from her home.   Mood is still depressed and anxious. Frustrated that she feels like nothing is working.    Appetite is still low, she is trying eat, unchanged   Sleep: poor, no change. - 3 hrs/night, no issues falling asleep because of remeron, but would wake up 3 hrs later. Then would have restless leg and would be tossing and turning all night. She would then sleep a few hours during the day  EtOH: still drinking, near daily Craving,  Eye opener,  Has tremors and anxiety if she doesn't drink that day, does self resolve. No other sxs. Denied N/V, sweating, hot flashes,  Has not been using gabapentin as much to help with etoh craving, she doesn't know why Discussed cdiop, which she is amenable to if she could wake up in the AM to go. Open to therapy, no preference on M/F Long discussion on etoh and its impact on her sleep, mood and how this is a barrier for even the meds to work.  Cannabis: Denied Other substances: Denied  Patient amenable to med changes per above after discussing the risks, benefits, and side effects. Otherwise patient had no other questions or concerns and was amenable to plan per above.  Safety: Denied active and passive SI, HI, AVH, paranoia. Patient is aware of [facility], 988 and 911 as well.   Review of Systems  Constitutional:  Positive for activity change, appetite change and fatigue. Negative for diaphoresis and unexpected weight change.  Respiratory:  Negative for shortness of breath.   Cardiovascular:  Negative for chest pain.  Gastrointestinal:  Positive for constipation. Negative for abdominal pain and diarrhea.  Neurological:  Negative for dizziness and light-headedness.   Visit Diagnosis:    ICD-10-CM   1. Major depressive disorder, recurrent severe without psychotic features (HCC)  F33.2 Comprehensive metabolic panel    Vitamin D 1,25 dihydroxy     TSH + free T4    TSH + free T4    Vitamin D 1,25 dihydroxy    Comprehensive metabolic panel    mirtazapine (REMERON) 30 MG tablet    CANCELED: TSH + free T4    CANCELED: Comprehensive metabolic panel    CANCELED: VITAMIN D 25 Hydroxy (Vit-D Deficiency, Fractures)    2. Alcohol use disorder  F10.90 CANCELED: TSH + free T4    CANCELED: Comprehensive metabolic panel    CANCELED: VITAMIN D 25 Hydroxy (Vit-D Deficiency, Fractures)    3. Generalized anxiety disorder with panic attacks  F41.1 gabapentin (NEURONTIN) 300 MG capsule   F41.0 mirtazapine (REMERON) 30 MG tablet    CANCELED: TSH + free T4    CANCELED: Comprehensive metabolic panel    CANCELED: VITAMIN D 25 Hydroxy (Vit-D Deficiency, Fractures)        Past Psychiatric History:  Diagnoses: MDD, GAD at 43yo, grief, alcohol use disorder, tobacco use disorder, cluster B traits, historical dx of bipolar d/o (however changed and removed due to no evidence of mania even on ssri, suspect substance induced mood d/o) Medication trials: zoloft (partial effectiveness at 150mg , non-adherent) Previous psychiatrist/therapist: yes therapy Hospitalizations: none Suicide attempts: none SIB: none Hx of violence towards  others: none Current access to guns: none Hx of abuse: none that she can remember but at age 16 wondered if something happened to her in childhood and dreams about it. Sees herself touching a non-specific man; consistently she is a teenager  Substance Use History: EtOH:  reports current alcohol use of about 5.0 standard drinks of alcohol per week. Nicotine: Black and mild weekly, last time 06/2023 Marijuana: Denied IV drug use: Denied Stimulants: Denied Opiates: Denied Sedative/hypnotics: Denied Hallucinogens: Denied DT: Denied Detox: Denied Residential: Denied Previous: Per above  Past Medical History: Dx:  has a past medical history of Anemia, Anxiety, Bipolar disorder (HCC), Depression, Ectopic pregnancy,  Fibroids, Headache, and Retained products of conception after miscarriage (07/12/2022).  Head trauma: Denied Seizures: Denied Allergies: Venofer [iron sucrose]   Family Psychiatric History:  67 and 66 year old children with anger and emotional   Suicide: Sister attempted Homicide: Few uncles Psych hospitalization: littler sister BiPD: grandma SCZ/SCzA: Denied Substance use: Dad and mom   Social History:  Housing: Living with 3 children Income: Unemployed currently, FMLA Children: 3x Support: Mom Guns/Weapons: Denied Legal: Denied  Past Medical History:  Past Medical History:  Diagnosis Date   Anemia    Anxiety    Bipolar disorder (HCC)    Depression    Ectopic pregnancy    Fibroids    Headache    Retained products of conception after miscarriage 07/12/2022    Past Surgical History:  Procedure Laterality Date   BUNIONECTOMY Left    DILATION AND CURETTAGE OF UTERUS N/A 06/26/2022   Procedure: SUCTION DILATATION AND CURETTAGE;  Surgeon: Lazaro Arms, MD;  Location: [facility] OR;  Service: Gynecology;  Laterality: N/A;   DILATION AND EVACUATION N/A 07/12/2022   Procedure: DILATATION AND EVACUATION;  Surgeon: Reva Bores, MD;  Location: [facility] OR;  Service: Gynecology;  Laterality: N/A;   Family History:  Family History  Family history unknown: Yes   Social History   Socioeconomic History   Marital status: Single    Spouse name: Not on file   Number of children: Not on file   Years of education: Not on file   Highest education level: Not on file  Occupational History   Not on file  Tobacco Use   Smoking status: Former    Current packs/day: 0.00    Types: Cigarettes    Quit date: 06/2023    Years since quitting: 0.2   Smokeless tobacco: Never   Tobacco comments:    06/25/22- "long time ago."    8/20204-black and mild weekly > last time 06/2023  Vaping Use   Vaping status: Never Used  Substance and Sexual Activity   Alcohol use: Yes    Alcohol/week: 5.0  standard drinks of alcohol    Types: 5 Shots of liquor per week    Comment: sometimes,"I do not count how many at a time". Patient 's sister called triage at [facility] to report that patient had been drinking alot since she lost the baby.  On 10/18/2022 patient endorsed drinking bottle Tequila over the course of each weekend   Drug use: No   Sexual activity: Yes    Birth control/protection: None  Other Topics Concern   Not on file  Social History Narrative   Not on file   Social Drivers of Health   Financial Resource Strain: Not on file  Food Insecurity: Not on file  Transportation Needs: Not on file  Physical Activity: Not on file  Stress: Not on  file  Social Connections: Unknown (01/16/2022)   [facility]   Social Network    Social Network: Not on file    Allergies:  Allergies  Allergen Reactions   Venofer [Iron Sucrose] Hives and Itching    Pt with hives and itching 45 min after completion of venofer 300mg     Current Medications: Current Outpatient Medications  Medication Sig Dispense Refill   gabapentin (NEURONTIN) 300 MG capsule Take 1 capsule (300 mg total) by mouth 2 (two) times daily as needed. 60 capsule 1   mirtazapine (REMERON) 30 MG tablet Take 1 tablet (30 mg total) by mouth at bedtime. 30 tablet 1   naproxen (NAPROSYN) 375 MG tablet Take 1 tablet (375 mg total) by mouth 2 (two) times daily as needed for headache. 30 tablet 0   prochlorperazine (COMPAZINE) 10 MG tablet Take 0.5-1 tablets (5-10 mg total) by mouth every 6 (six) hours as needed (dizziness or headache). 30 tablet 0   No current facility-administered medications for this visit.    Objective: Psychiatric Specialty Exam: Blood pressure 129/85, pulse 71, height 5' 6.73" (1.695 m), weight 200 lb 3.2 oz (90.8 kg), not currently breastfeeding.Body mass index is 31.61 kg/m.  General Appearance: Casual, faily groomed  Eye Contact:  Good    Speech:  Clear, coherent, normal  rate   Volume:  Normal   Mood: see above  Affect:  Appropriate, congruent, restricted, withdrawn and melancholic appearing  Thought Content: Logical, rumination  Suicidal Thoughts: Denied active and passive SI    Thought Process:  Coherent, goal-directed, linear   Orientation:  A&Ox4   Memory:  Immediate good  Judgment:  Fair   Insight:  Shallow  Concentration:  Attention and concentration good   Recall:  Good  Fund of Knowledge: Good  Language: Good, fluent  Psychomotor Activity: decreased  Akathisia:  NA   AIMS (if indicated): NA   Assets:  Communication Skills Desire for Improvement Financial Resources/Insurance Housing Leisure Time Physical Health Social Support Talents/Skills Transportation Vocational/Educational  ADL's:  Intact  Cognition: WNL  Sleep:  see above    Physical Exam Vitals and nursing note reviewed.  Constitutional:      General: She is awake. She is not in acute distress.    Appearance: She is not ill-appearing or diaphoretic.  HENT:     Head: Normocephalic.  Pulmonary:     Effort: Pulmonary effort is normal. No respiratory distress.  Neurological:     General: No focal deficit present.     Mental Status: She is alert and oriented to person, place, and time.     Cranial Nerves: No cranial nerve deficit.     Sensory: No sensory deficit.      Metabolic Disorder Labs: No results found for: "HGBA1C", "MPG" No results found for: "PROLACTIN" No results found for: "CHOL", "TRIG", "HDL", "CHOLHDL", "VLDL", "LDLCALC" No results found for: "TSH"  Therapeutic Level Labs: No results found for: "LITHIUM" No results found for: "VALPROATE" No results found for: "CBMZ"  Screenings: GAD-7    Flowsheet Row Office Visit from 08/02/2022 in [facility] at [facility]  Total GAD-7 Score 17      PHQ2-9    Flowsheet Row Counselor from 08/28/2023 in [facility] Office Visit from 10/18/2022 in [facility] Office Visit from 08/02/2022 in [facility] at [facility]  PHQ-2 Total Score 6 6 4   PHQ-9  Total Score 22 15 15       Flowsheet Row Counselor from 08/28/2023 in [facility] ED from 08/20/2023 in [facility] at [facility] ED from 07/15/2023 in [facility]  C-SSRS RISK CATEGORY Error: Question 2 not populated No Risk No Risk       Patient/Guardian was advised Release of Information must be obtained prior to any record release in order to collaborate their care with an outside provider. Patient/Guardian was advised if they have not already done so to contact the registration department to sign all necessary forms in order for [place] to release information regarding their care.   Consent: Patient/Guardian gives verbal consent for treatment and assignment of benefits for services provided during this visit. Patient/Guardian expressed understanding and agreed to proceed.   [name], DO Psych Resident, PGY-3

## 2023-10-03 ENCOUNTER — Encounter (HOSPITAL_COMMUNITY): Payer: Self-pay | Admitting: Student

## 2023-10-04 NOTE — Addendum Note (Signed)
Addended by: Everlena Cooper on: 10/04/2023 06:59 PM   Modules accepted: Level of Service

## 2023-10-07 ENCOUNTER — Ambulatory Visit: Payer: Self-pay | Admitting: Neurology

## 2023-10-07 ENCOUNTER — Encounter: Payer: Self-pay | Admitting: Neurology

## 2023-10-08 ENCOUNTER — Telehealth (HOSPITAL_COMMUNITY): Payer: Self-pay | Admitting: Student

## 2023-10-08 DIAGNOSIS — E870 Hyperosmolality and hypernatremia: Secondary | ICD-10-CM

## 2023-10-08 DIAGNOSIS — F1090 Alcohol use, unspecified, uncomplicated: Secondary | ICD-10-CM

## 2023-10-08 MED ORDER — NALTREXONE HCL 50 MG PO TABS: 50.0000 mg | ORAL_TABLET | Freq: Every day | ORAL | 1 refills | Status: DC

## 2023-10-08 NOTE — Telephone Encounter (Signed)
Called to discuss lab results - mild incr Na, AST/ALT wnl  A&Ox4 Drinking water, denied excessive sodium intake. Reported she feels like she does urinate a bit more, but doesn't feel abnormal. She is still drinking.  A: Mild hypernatremia AUD  Suspect elevated Na level 2/2 excessive EtOH. Repeating BMP.   P: Repeat BMP Encouraged EtOH cessation STARTED naltrexone 50 mg at bedtime as discussed during last encounter

## 2023-10-10 ENCOUNTER — Encounter (HOSPITAL_COMMUNITY): Payer: 59 | Admitting: Student

## 2023-10-14 ENCOUNTER — Inpatient Hospital Stay: Payer: 59

## 2023-10-15 ENCOUNTER — Encounter (HOSPITAL_COMMUNITY): Payer: Self-pay | Admitting: Licensed Clinical Social Worker

## 2023-10-15 ENCOUNTER — Ambulatory Visit (HOSPITAL_COMMUNITY): Payer: 59 | Admitting: Licensed Clinical Social Worker

## 2023-10-17 ENCOUNTER — Ambulatory Visit (HOSPITAL_COMMUNITY): Payer: 59

## 2023-10-18 LAB — COMPREHENSIVE METABOLIC PANEL
ALT: 12 [IU]/L (ref 0–32)
AST: 14 [IU]/L (ref 0–40)
Albumin: 3.8 g/dL — ABNORMAL LOW (ref 3.9–4.9)
Alkaline Phosphatase: 103 [IU]/L (ref 44–121)
BUN/Creatinine Ratio: 13 (ref 9–23)
BUN: 11 mg/dL (ref 6–24)
Bilirubin Total: 0.4 mg/dL (ref 0.0–1.2)
CO2: 19 mmol/L — ABNORMAL LOW (ref 20–29)
Calcium: 9.2 mg/dL (ref 8.7–10.2)
Chloride: 107 mmol/L — ABNORMAL HIGH (ref 96–106)
Creatinine, Ser: 0.85 mg/dL (ref 0.57–1.00)
Globulin, Total: 2.6 g/dL (ref 1.5–4.5)
Glucose: 96 mg/dL (ref 70–99)
Potassium: 4.1 mmol/L (ref 3.5–5.2)
Sodium: 146 mmol/L — ABNORMAL HIGH (ref 134–144)
Total Protein: 6.4 g/dL (ref 6.0–8.5)
eGFR: 88 mL/min/{1.73_m2} (ref >59)

## 2023-10-18 LAB — TSH+FREE T4
Free T4: 0.87 ng/dL (ref 0.82–1.77)
TSH: 4.04 u[IU]/mL (ref 0.450–4.500)

## 2023-10-18 LAB — VITAMIN D 1,25 DIHYDROXY
Vitamin D 1, 25 (OH)2 Total: 72 pg/mL — ABNORMAL HIGH
Vitamin D2 1, 25 (OH)2: <10 pg/mL
Vitamin D3 1, 25 (OH)2: 72 pg/mL

## 2023-10-21 ENCOUNTER — Ambulatory Visit (HOSPITAL_COMMUNITY): Payer: 59

## 2023-10-21 ENCOUNTER — Inpatient Hospital Stay: Payer: 59 | Admitting: Hematology and Oncology

## 2023-10-21 NOTE — Assessment & Plan Note (Signed)
Iron deficiency anemia most likely related to heavy menstrual cycles   Prior treatment: IV Venofer 04/26/2023 (allergy reaction) Lab review: 04/15/2023: Ferritin 2, iron saturation 3%, TIBC 518, B12 375, folate 4.  Hemoglobin 10.1 07/15/2023: Ferritin 20, folate 6.2, B12 357, iron saturation 78%, hemoglobin 11.8 08/20/2023: Hemoglobin 12.1, WBC 3.5   I discussed with the patient that she has had significant improvement in her iron levels and her hemoglobin. No indication for IV iron at this time.   Leukopenia New finding of low white blood cell count (3.3). No recent infections or new medications reported.  Recheck labs in 3 months and telephone visit follow-up after that to discuss results.  If she were to need IV iron in the future we will have to obtain Feraheme.

## 2023-10-22 ENCOUNTER — Encounter (HOSPITAL_COMMUNITY): Payer: Self-pay | Admitting: Student

## 2023-10-23 ENCOUNTER — Inpatient Hospital Stay: Payer: 59 | Attending: Hematology and Oncology

## 2023-10-25 ENCOUNTER — Telehealth: Payer: Self-pay | Admitting: *Deleted

## 2023-10-25 NOTE — Telephone Encounter (Signed)
 Pt appt for 10/28/23 canceled due to pt being a no show for labs on 10/23/23.  High priority message sent to scheduling team to schedule lab and MD 2 days post lab.  Pt notified and verbalized understanding.

## 2023-10-28 ENCOUNTER — Telehealth: Payer: Self-pay | Admitting: Hematology and Oncology

## 2023-10-28 ENCOUNTER — Telehealth: Payer: 59 | Admitting: Hematology and Oncology

## 2023-10-28 NOTE — Telephone Encounter (Signed)
 Rescheduled appointments per 2/7 scheduling message. Patiens voicemail was full. Patient is active on MyChart and will be mailed an appointment reminder.

## 2023-11-06 ENCOUNTER — Ambulatory Visit (HOSPITAL_COMMUNITY): Payer: 59 | Admitting: Student

## 2023-11-12 ENCOUNTER — Inpatient Hospital Stay: Payer: 59

## 2023-11-13 ENCOUNTER — Encounter (HOSPITAL_BASED_OUTPATIENT_CLINIC_OR_DEPARTMENT_OTHER): Payer: 59 | Admitting: Student

## 2023-11-13 ENCOUNTER — Encounter (HOSPITAL_COMMUNITY): Payer: Self-pay | Admitting: Student

## 2023-11-13 DIAGNOSIS — F1090 Alcohol use, unspecified, uncomplicated: Secondary | ICD-10-CM

## 2023-11-13 NOTE — Patient Instructions (Signed)
 For even more resources, please see link below or search "NIAAA Finding Help" then search for "Resource" LInk  https://www.gardner-medina.com/   Treatment Finders NIAAA Alcohol Treatment Navigator--How to Find Quality Alcohol Treatment https://alcoholtreatment.niaaa.nih.gov/how-to-find-alcohol-treatment?_gl=1*72zxnr*_ga*OTE3NDI1MTkyLjE3MzI1Njc3OTc.*_ga_E2D8B2PVE9*MTc0MDU5MjE1NS4xLjEuMTc0MDU5MjM0OS42MC4wLjA.  SAMHSA's Behavioral Health Treatment Services Locator 1-800-662-HELP (480) 521-0755) http://lester.info/  Mutual-Support Groups Alcoholics Anonymous (AA)-- app for iOS and Android smartphones 339 684 2492  EscrowEtc.es  LifeRing  305-826-8176 BingoPublishing.hu  SMART Recovery  (971)121-3736 https://smartrecovery.org/  Women for Sobriety  561-581-4742 http://www.flowers.com/  Groups for Family and Friends Adult Children of Alcoholics Dysfunctional Families World Service Organization (323)858-0637 https://adultchildren.org/  Al-Anon Family Groups (310)341-4695 for meetings https://al-anon.org/  SMART Recovery Family & Friends 315-761-3091 https://smartrecovery.org/family  Online-Only Resources If you have internet access, you may wish to visit the following websites:  NIAAA Alcohol Treatment Navigator (Three-step road map to evidence-based treatment) https://alcoholtreatment.niaaa.nih.gov/?_gl=1*16ufsfu*_ga*OTE3NDI1MTkyLjE3MzI1Njc3OTc.*_ga_E2D8B2PVE9*MTc0MDU5MjE1NS4xLjEuMTc0MDU5MjM0OS42MC4wLjA.  NIAAA Facts About Teen Drinking https://niaaaforteens.niaaa.nih.gov/?_gl=1*16ufsfu*_ga*OTE3NDI1MTkyLjE3MzI1Njc3OTc.*_ga_E2D8B2PVE9*MTc0MDU5MjE1NS4xLjEuMTc0MDU5MjM0OS42MC4wLjA.  National Association of Social Workers Arts development officer for social workers with addiction specialties.) TastyShow.cz  Moderation ManagementT (Mutual-support  group) https://moderation.org/  Secular AA (Mutual-support group) https://www.sherman-hill.com/  E-Health Alcohol Treatment Tools* Below are samples of e-health tools developed with NIAAA funding. Each of these fee-based tools has a research base that shows its potential to help people cut down or quit drinking.  A-CHESS--A mobile tool to prevent a return to drinking; available from some specialty treatment providers and programs.  CBT4CBT--A self-guided, web-based cognitive-behavioral therapy program that teaches skills to help people stop or reduce drinking; health professionals can provide a prescription.  CheckUp & Choices--A digital self-help program to guide people in deciding whether to change their drinking habits and developing skills to make a change.

## 2023-11-13 NOTE — Progress Notes (Signed)
 Able to get in contact with patient, she didn't know she had an appointment today. Rescheduled her to 3/5 at 2:30pm

## 2023-11-14 ENCOUNTER — Telehealth: Payer: Self-pay | Admitting: *Deleted

## 2023-11-14 ENCOUNTER — Inpatient Hospital Stay: Payer: 59 | Admitting: Hematology and Oncology

## 2023-11-14 NOTE — Assessment & Plan Note (Signed)
 Iron deficiency anemia most likely related to heavy menstrual cycles  Prior treatment: IV Venofer 04/26/2023 (allergy reaction) Lab review: 04/15/2023: Ferritin 2, iron saturation 3%, TIBC 518, B12 375, folate 4.  Hemoglobin 10.1 07/15/2023: Ferritin 20, folate 6.2, B12 357, iron saturation 78%, hemoglobin 11.8  I discussed with the patient that she has had significant improvement in her iron levels and her hemoglobin. No indication for IV iron at this time.  Recheck labs in 3 months and telephone visit follow-up after that to discuss results.  If she were to need IV iron in the future we will have to obtain Feraheme.

## 2023-11-14 NOTE — Telephone Encounter (Signed)
 Pt was a no show for lab work yesterday.  Per MD, appt needing to be canceled for today because lab work needs to be done 1 to 2 days prior. RN contacted pt who states she does not need labs or f/u at this time and will contact our office if she starts to feel like her iron is low.

## 2023-11-18 ENCOUNTER — Telehealth (HOSPITAL_COMMUNITY): Payer: Self-pay | Admitting: Student

## 2023-11-20 ENCOUNTER — Telehealth (HOSPITAL_BASED_OUTPATIENT_CLINIC_OR_DEPARTMENT_OTHER): Payer: 59 | Admitting: Student

## 2023-11-20 ENCOUNTER — Encounter (HOSPITAL_COMMUNITY): Payer: Self-pay | Admitting: Student

## 2023-11-20 DIAGNOSIS — F410 Panic disorder [episodic paroxysmal anxiety] without agoraphobia: Secondary | ICD-10-CM | POA: Diagnosis not present

## 2023-11-20 DIAGNOSIS — Z87891 Personal history of nicotine dependence: Secondary | ICD-10-CM | POA: Diagnosis not present

## 2023-11-20 DIAGNOSIS — F411 Generalized anxiety disorder: Secondary | ICD-10-CM | POA: Diagnosis not present

## 2023-11-20 DIAGNOSIS — F1090 Alcohol use, unspecified, uncomplicated: Secondary | ICD-10-CM

## 2023-11-20 DIAGNOSIS — F332 Major depressive disorder, recurrent severe without psychotic features: Secondary | ICD-10-CM

## 2023-11-20 NOTE — Progress Notes (Signed)
 Virtual Visit via Video Note   I connected with Lindsey Hunt on 11/20/2023,  2:30 PM EST by a video enabled telemedicine application and verified that I am speaking with the correct person using two identifiers.   Location: Patient: Home  Provider: Clinic   I discussed the limitations of evaluation and management by telemedicine and the availability of in person appointments. The patient expressed understanding and agreed to proceed.   Follow Up Instructions:   I discussed the assessment and treatment plan with the patient. The patient was provided an opportunity to ask questions and all were answered. The patient agreed with the plan and demonstrated an understanding of the instructions.   The patient was advised to call back or seek an in-person evaluation if the symptoms worsen or if the condition fails to improve as anticipated.   [name], DO Psych Resident, PGY-3  [facility] MD Outpatient Progress Note  11/20/2023 4:47 PM Lindsey Hunt  MRN: [id number]  Assessment:  Lindsey Hunt presents for follow-up evaluation.  Identifying Information: Lindsey Hunt is a 43 y.o. female with a history of MDD, GAD w panic attacks, AUD, cluster B traits  who is an established patient with [facility] for management of mood lability and EtOH use  Risk Assessment: An assessment of suicide and violence risk factors was performed as part of this evaluation and is not significantly changed from the last visit.             While future psychiatric events cannot be accurately predicted, the patient does not currently require acute inpatient psychiatric care and does not currently meet [facility] involuntary commitment criteria.          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 Atypical presentation. Considered other rx such as ssri, but difficulties with medication adherence if dosed AM, so opted for remeron as her biggest concerns were sleep and appetite. TODAY: She has been adherent to medication thus far and has cut back on EtOH, feels that she is less irritable/labile, but still gets very irritable. Still very depressed, with significant neurovegetative symptoms.  Holding off on this adjustment, given EtOH use, which she is cutting back per below, suspect her mood will improve after cessation. Continued home remeron 30 mg at bedtime (i1/16/2025) Labs: CBC, CMP, vitamin D, TSH - collected  # Generalized anxiety disorder with panic attacks Past medication trials: see above Status of problem: Improving TODAY: Anxiety still present, she has been working on using the gabapentin to alleviate it so she does not drink EtOH as much. Interventions: Remeron per above Continued home gabapentin PRN  # Alcohol use disorder (no sz, no DT) Past medication trials: gabapentin Status of problem: Improving No history of complicated withdrawal. Started drinking near daily again since 06/2023, about 5 shots a night to help with negative and ruminating thoughts.  11/2023 has cut back EtOH to about 3-4 times a week, sill ~3-5 shots of tequila. Has not started naltrexone yet, did not realize that was her pharmacy.  Will continue original plan of starting naltrexone per below and encouraged to lean on gabapentin more for anxiety/EtOH craving. Interventions: STARTED naltrexone 50 mg qPM (s3/01/2024) Encouraged continued limiting EtOH Continued gabapentin 300 mg BID  PRN  # Restless leg syndrome Past medication trials: gabapentin Status of problem:  2/2 IDA. Repleated, improved and alleviated with gabapentin Interventions: Gabapentin per above  # Tobacco use disorder, early remission  (06/2023) Past medication trials:  Status of problem:  Was smoking black and mild weekly Interventions: Encouraged cessation   Health Maintenance PCP: Salvatore Decent, PA-C @ [facility]  IDA - repletion per hem/onc   Return to care in: Future Appointments  Date Time Provider Department Center  12/23/2023  3:00 PM [name], DO [facility] None  12/31/2023  2:30 PM Schlosberg, Chryl Heck, LCSW [facility] None    Patient was given contact information for behavioral health clinic and was instructed to call 911 for emergencies.    Patient and plan of care will be discussed with the Attending MD, who agrees with the above statement and plan.   Subjective:  Chief Complaint:  Chief Complaint  Patient presents with   Follow-up   Medication Management   Anxiety   Depression   Addiction Problem    AUD   Interval History:   Unaccompanied.  More consistent with meds. Feels that her mood is more stable, however still significantly irritable, does not want to think, mainly laying in sleeping.  When she is awake, still having ruminating thoughts, making her crave alcohol.  Does feel that gabapentin works.  She tries not to take it if she can help it.  Instructed her to lean on gabapentin more than EtOH and can back off of gabapentin once she has stopped drinking. Sleep and eating habit are the same. Over-sleeping, low energy, low appetite, no weight changes  Missed appointment for iron-deficiency follow-up, did not know she had it.   EtOH - not every day, no symptoms of withdrawal -last drink was Monday, had 3 shots of tequila -drinks 3-4x/week -still drinks only tequila, about 3-5 shots on days that she does drink -helps with ruminating thoughts -has not started naltrexone  Patient amenable to med changes per above after discussing the risks, benefits, and side effects. Otherwise patient had no other questions or concerns and was amenable to plan per above.  Safety: Denied  active and passive SI, HI, AVH, paranoia. Patient is aware of [facility], 988 and 911 as well.   Review of Systems  Constitutional:  Positive for activity change (psychomotor slowing), appetite change (low) and fatigue. Negative for diaphoresis and unexpected weight change.  Respiratory:  Negative for shortness of breath.   Cardiovascular:  Negative for chest pain.  Gastrointestinal:  Positive for constipation. Negative for abdominal pain and diarrhea.  Neurological:  Negative for dizziness and light-headedness.   Visit Diagnosis:    ICD-10-CM   1. Alcohol use disorder  F10.90 naltrexone (DEPADE) 50 MG tablet    2. Major depressive disorder, recurrent severe without psychotic features (HCC)  F33.2 mirtazapine (REMERON) 30 MG tablet    3. Generalized anxiety disorder with panic attacks  F41.1 gabapentin (NEURONTIN) 300 MG capsule   F41.0 mirtazapine (REMERON) 30 MG tablet    4. History of tobacco use disorder  Z87.891      Past Psychiatric History:  Diagnoses: MDD, GAD at 43yo, grief after miscarriage in 2023, alcohol use disorder, tobacco use disorder, cluster B traits, historical dx of bipolar d/o (however changed and removed due to no evidence of mania even on ssri, suspect substance induced mood d/o) Medication trials: zoloft (partial effectiveness at 150mg , non-adherent) Previous psychiatrist/therapist: yes therapy Hospitalizations: none Suicide attempts: none SIB: none Hx of  violence towards others: none Current access to guns: none Hx of abuse: none that she can remember but at age 46 wondered if something happened to her in childhood and dreams about it. Sees herself touching a non-specific man; consistently she is a teenager  Substance Use History: EtOH:  reports current alcohol use of about 5.0 standard drinks of alcohol per week.  Was drinking near daily around 5 shots of tequila since 2023 after miscarriage.  11/2023 2-5x shots of tequila about 3-4 times a week. Nicotine: Black  and mild weekly, last time 06/2023 Marijuana: Denied IV drug use: Denied Stimulants: Denied Opiates: Denied Sedative/hypnotics: Denied Hallucinogens: Denied DT: Denied Detox: Denied Residential: Denied Previous: Per above  Past Medical History: Dx:  has a past medical history of Anemia, Anxiety, Bipolar disorder (HCC), Depression, Ectopic pregnancy, Fibroids, Headache, and Retained products of conception after miscarriage (07/12/2022).  Head trauma: Denied Seizures: Denied Allergies: Venofer [iron sucrose]   Family Psychiatric History:  29 and 81 year old children with anger and emotional   Suicide: Sister attempted Homicide: Few uncles Psych hospitalization: littler sister BiPD: grandma SCZ/SCzA: Denied Substance use: Dad and mom   Social History:  Housing: Living with 3 children Income: Unemployed currently Children: 3x Support: Mom Guns/Weapons: Denied Legal: Denied  Past Medical History:  Past Medical History:  Diagnosis Date   Anemia    Anxiety    Bipolar disorder (HCC)    Depression    Ectopic pregnancy    Fibroids    Headache    Retained products of conception after miscarriage 07/12/2022    Past Surgical History:  Procedure Laterality Date   BUNIONECTOMY Left    DILATION AND CURETTAGE OF UTERUS N/A 06/26/2022   Procedure: SUCTION DILATATION AND CURETTAGE;  Surgeon: Lazaro Arms, MD;  Location: [facility] OR;  Service: Gynecology;  Laterality: N/A;   DILATION AND EVACUATION N/A 07/12/2022   Procedure: DILATATION AND EVACUATION;  Surgeon: Reva Bores, MD;  Location[facility] OR;  Service: Gynecology;  Laterality: N/A;   Family History:  Family History  Family history unknown: Yes   Social History   Socioeconomic History   Marital status: Single    Spouse name: Not on file   Number of children: Not on file   Years of education: Not on file   Highest education level: Not on file  Occupational History   Not on file  Tobacco Use   Smoking status: Former     Current packs/day: 0.00    Types: Cigarettes    Quit date: 06/2023    Years since quitting: 0.4   Smokeless tobacco: Never   Tobacco comments:    06/25/22- "long time ago."    8/20204-black and mild weekly > last time 06/2023  Vaping Use   Vaping status: Never Used  Substance and Sexual Activity   Alcohol use: Yes    Alcohol/week: 5.0 standard drinks of alcohol    Types: 5 Shots of liquor per week    Comment: sometimes,"I do not count how many at a time". Patient 's sister called triage at [facility] to report that patient had been drinking alot since she lost the baby.  On 10/18/2022 patient endorsed drinking bottle Tequila over the course of each weekend   Drug use: No   Sexual activity: Yes    Birth control/protection: None  Other Topics Concern   Not on file  Social History Narrative   Not on file   Social Drivers of Corporate investment banker  Strain: Not on file  Food Insecurity: Not on file  Transportation Needs: Not on file  Physical Activity: Not on file  Stress: Not on file  Social Connections: Unknown (01/16/2022)   Received from [facility]   Social Network    Social Network: Not on file    Allergies:  Allergies  Allergen Reactions   Venofer [Iron Sucrose] Hives and Itching    Pt with hives and itching 45 min after completion of venofer 300mg     Current Medications: Current Outpatient Medications  Medication Sig Dispense Refill   gabapentin (NEURONTIN) 300 MG capsule Take 1 capsule (300 mg total) by mouth 2 (two) times daily as needed. 60 capsule 2   mirtazapine (REMERON) 30 MG tablet Take 1 tablet (30 mg total) by mouth at bedtime. 30 tablet 2   naltrexone (DEPADE) 50 MG tablet Take 1 tablet (50 mg total) by mouth at bedtime. 30 tablet 2   naproxen (NAPROSYN) 375 MG tablet Take 1 tablet (375 mg total) by mouth 2 (two) times daily as needed for headache. 30 tablet 0   prochlorperazine (COMPAZINE) 10 MG tablet Take 0.5-1 tablets (5-10 mg  total) by mouth every 6 (six) hours as needed (dizziness or headache). 30 tablet 0   No current facility-administered medications for this visit.    Objective: Psychiatric Specialty Exam: There were no vitals taken for this visit.There is no height or weight on file to calculate BMI.  General Appearance: Casual, faily groomed  Eye Contact: Fair  Speech:  Clear, coherent, normal rate, spontaneous  Volume:  Normal   Mood: see above  Affect:  Appropriate, congruent, restricted, withdrawn and melancholic appearing  Thought Content: Logical, rumination  Suicidal Thoughts: Denied active and passive SI    Thought Process:  Coherent, goal-directed, linear   Orientation:  A&Ox4   Memory:  Immediate good  Judgment:  Fair   Insight:  Shallow  Concentration:  Attention and concentration fair  Recall:  Good  Fund of Knowledge: Good  Language: Good, fluent  Psychomotor Activity: decreased  Akathisia:  NA   AIMS (if indicated): NA   Assets:  Communication Skills Desire for Improvement Financial Resources/Insurance Housing Leisure Time Physical Health Social Support Talents/Skills Transportation Vocational/Educational  ADL's:  Intact  Cognition: WNL  Sleep:  see above    Physical Exam Constitutional:      General: She is awake. She is not in acute distress.    Appearance: She is not ill-appearing or diaphoretic.  HENT:     Head: Normocephalic.  Pulmonary:     Effort: Pulmonary effort is normal. No respiratory distress.  Neurological:     Mental Status: She is alert and oriented to person, place, and time.     Metabolic Disorder Labs: No results found for: "HGBA1C", "MPG" No results found for: "PROLACTIN" No results found for: "CHOL", "TRIG", "HDL", "CHOLHDL", "VLDL", "LDLCALC" Lab Results  Component Value Date   TSH 4.040 10/02/2023   Therapeutic Level Labs: No results found for: "LITHIUM" No results found for: "VALPROATE" No results found for:  "CBMZ"  Screenings: GAD-7    Flowsheet Row Office Visit from 08/02/2022 in [facility]  Total GAD-7 Score 17      PHQ2-9    Flowsheet Row Counselor from 08/28/2023 in [facility] Office Visit from 10/18/2022 in [facility] [place] Office Visit from 08/02/2022 in [facility] at [facility]  PHQ-2 Total Score 6 6 4   PHQ-9 Total Score 22 15 15       Flowsheet Row Counselor from 08/28/2023 in [facility] ED from 08/20/2023 in [facility] Emergency Department at [facility] from 07/15/2023 in [facility] Emergency Department at [facility]  C-SSRS RISK CATEGORY Error: Question 2 not populated No Risk No Risk      Patient/Guardian was advised Release of Information must be obtained prior to any record release in order to collaborate their care with an outside provider. Patient/Guardian was advised if they have not already done so to contact the registration department to sign all necessary forms in order for [place] to release information regarding their care.   Consent: Patient/Guardian gives verbal consent for treatment and assignment of benefits for services provided during this visit. Patient/Guardian expressed understanding and agreed to proceed.   [name], DO Psych Resident, PGY-3

## 2023-11-21 ENCOUNTER — Encounter (HOSPITAL_COMMUNITY): Payer: Self-pay | Admitting: Student

## 2023-11-21 MED ORDER — MIRTAZAPINE 30 MG PO TABS: 30.0000 mg | ORAL_TABLET | Freq: Every day | ORAL | 2 refills | Status: DC

## 2023-11-21 MED ORDER — GABAPENTIN 300 MG PO CAPS: 300.0000 mg | ORAL_CAPSULE | Freq: Two times a day (BID) | ORAL | 2 refills | Status: DC | PRN

## 2023-11-21 MED ORDER — NALTREXONE HCL 50 MG PO TABS: 50.0000 mg | ORAL_TABLET | Freq: Every day | ORAL | 2 refills | Status: DC

## 2023-11-23 NOTE — Addendum Note (Signed)
 Addended by: Everlena Cooper on: 11/23/2023 09:37 AM   Modules accepted: Level of Service

## 2023-12-04 ENCOUNTER — Ambulatory Visit: Payer: Self-pay | Admitting: Neurology

## 2023-12-10 ENCOUNTER — Telehealth (HOSPITAL_COMMUNITY): Payer: Self-pay | Admitting: *Deleted

## 2023-12-10 NOTE — Telephone Encounter (Signed)
 VM received from [employer] [phone]) requesting status update on mental health assessment r/t Disability claim. The forms were sent to you on 12/05/23. Please reference case #[id number].

## 2023-12-11 NOTE — Telephone Encounter (Signed)
 Got it. Thank you.

## 2023-12-23 ENCOUNTER — Ambulatory Visit (HOSPITAL_COMMUNITY): Admitting: Student

## 2023-12-30 ENCOUNTER — Encounter (HOSPITAL_COMMUNITY): Admitting: Student

## 2023-12-30 ENCOUNTER — Encounter (HOSPITAL_COMMUNITY): Payer: Self-pay | Admitting: Student

## 2023-12-30 ENCOUNTER — Encounter (HOSPITAL_COMMUNITY): Payer: Self-pay

## 2023-12-30 NOTE — Progress Notes (Signed)
 ERRONEOUS ENCOUNTER, PLEASE DISREGARD Patient no-showed  Unable to get in contact, left hipaa compliant vm

## 2023-12-31 ENCOUNTER — Ambulatory Visit (INDEPENDENT_AMBULATORY_CARE_PROVIDER_SITE_OTHER): Payer: Self-pay | Admitting: Licensed Clinical Social Worker

## 2023-12-31 ENCOUNTER — Encounter (HOSPITAL_COMMUNITY): Payer: Self-pay

## 2023-12-31 ENCOUNTER — Encounter (HOSPITAL_COMMUNITY): Payer: Self-pay | Admitting: Licensed Clinical Social Worker

## 2023-12-31 DIAGNOSIS — F332 Major depressive disorder, recurrent severe without psychotic features: Secondary | ICD-10-CM

## 2023-12-31 NOTE — Progress Notes (Signed)
 No show

## 2024-01-13 ENCOUNTER — Telehealth (HOSPITAL_BASED_OUTPATIENT_CLINIC_OR_DEPARTMENT_OTHER): Admitting: Student

## 2024-01-13 ENCOUNTER — Encounter (HOSPITAL_COMMUNITY): Payer: Self-pay | Admitting: Student

## 2024-01-13 DIAGNOSIS — F1090 Alcohol use, unspecified, uncomplicated: Secondary | ICD-10-CM

## 2024-01-13 DIAGNOSIS — Z87891 Personal history of nicotine dependence: Secondary | ICD-10-CM

## 2024-01-13 DIAGNOSIS — F332 Major depressive disorder, recurrent severe without psychotic features: Secondary | ICD-10-CM | POA: Diagnosis not present

## 2024-01-13 DIAGNOSIS — F410 Panic disorder [episodic paroxysmal anxiety] without agoraphobia: Secondary | ICD-10-CM | POA: Diagnosis not present

## 2024-01-13 DIAGNOSIS — F411 Generalized anxiety disorder: Secondary | ICD-10-CM | POA: Diagnosis not present

## 2024-01-13 MED ORDER — GABAPENTIN 300 MG PO CAPS: 300.0000 mg | ORAL_CAPSULE | Freq: Two times a day (BID) | ORAL | 1 refills | Status: DC | PRN

## 2024-01-13 MED ORDER — THIAMINE HCL 100 MG PO TABS: 100.0000 mg | ORAL_TABLET | Freq: Every day | ORAL | 1 refills | Status: DC

## 2024-01-13 MED ORDER — MIRTAZAPINE 30 MG PO TABS
30.0000 mg | ORAL_TABLET | Freq: Every day | ORAL | 1 refills | Status: DC
Start: 2024-01-13 — End: 2024-01-13

## 2024-01-13 MED ORDER — NALTREXONE HCL 50 MG PO TABS: 50.0000 mg | ORAL_TABLET | Freq: Every day | ORAL | 1 refills | Status: DC

## 2024-01-13 MED ORDER — MIRTAZAPINE 30 MG PO TABS: 30.0000 mg | ORAL_TABLET | Freq: Every day | ORAL | 1 refills | Status: DC

## 2024-01-13 NOTE — Addendum Note (Signed)
 Addended by: Kenai Fluegel on: 01/13/2024 04:07 PM   Modules accepted: Orders

## 2024-01-13 NOTE — Addendum Note (Signed)
 Addended by: Donnelly Gainer on: 01/13/2024 04:02 PM   Modules accepted: Orders

## 2024-01-13 NOTE — Progress Notes (Addendum)
 Virtual Visit via Video Note   I connected with Lindsey Hunt on 01/13/2024,  1:30 PM EDT by a video enabled telemedicine application and verified that I am speaking with the correct person using two identifiers.   Location: Patient: Home  Provider: Clinic   I discussed the limitations of evaluation and management by telemedicine and the availability of in person appointments. The patient expressed understanding and agreed to proceed.   Follow Up Instructions:   I discussed the assessment and treatment plan with the patient. The patient was provided an opportunity to ask questions and all were answered. The patient agreed with the plan and demonstrated an understanding of the instructions.   The patient was advised to call back or seek an in-person evaluation if the symptoms worsen or if the condition fails to improve as anticipated.   Taquita Demby, DO Psych Resident, PGY-3   [facility] MD Outpatient Progress Note  01/13/2024 4:07 PM Lindsey Hunt  MRN: [id number]  Assessment:  Lindsey Hunt presents for follow-up evaluation in-person.   Identifying Information: Lindsey Hunt is a 43 y.o. 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   Risk Assessment: An assessment of suicide and violence risk factors was performed as part of this evaluation and is not significantly changed from the last visit.             While future psychiatric events cannot be accurately predicted, the patient does not currently require acute inpatient psychiatric care and does not currently meet [place]  involuntary commitment criteria.         Plan: Next appointment must be in person   # Alcohol use disorder (no sz, no DT) Past medication trials: naltrexone , gabapentin  Status of problem: Ongoing Started drinking  near daily again since 06/2023, about 5 shots a night to help with negative and ruminating thoughts. Currently daily - tequila. Does have withdrawal tremors when she doesn't drink. Longest bout of not drinking was 2 days, initially had shakiness that self-resolved. Drinking more than she was at last encounter. Last drink was earlier today.  Poor insight, helps her with anxiety. Out of the gabapentin , when she did have it, was using it intermittently and can't remember if it helped her or not. Recommended residential rehab or at least detox, both with she declined because of she has children that are minors at home and have no one who they can stay with. Talked about SIOP as well, but she declined given she was unable to participate in PHP/IOP. Considered going up on naltrexone  or starting campral, but currently is pre-contemplative about drinking, so unsure how much it would help. Also having her start OTC thiamine. Considered re-starting gabapentin , however given she is precontemplative about etoh and poor insight on it, there is concern for misuse rather than helping with etoh cessation. Holding off restarting gabapentin  at this time.  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 No change in depressed and anxious mood. She is taking remeron  consistently now, however she continues to drink etoh. Suspect minimal improvement in mood while drinking excessively, will  prioritize the AUD. No active or passive SI.  Continued home remeron  30 mg at bedtime (i1/16/2025)  # Restless leg syndrome Past medication trials: gabapentin  Status of problem: ongoing 2/2 IDA.  Repleated, better after iron  transfusion, but still present. Residual pain relieved with gabapentin  Interventions: Gabapentin  per above  # Tobacco use disorder, early remission (06/2023) Past medication trials:  Status of problem:  Was smoking black and mild weekly Interventions: Encouraged continued abstinence   Health Maintenance PCP: Birt Bulla, PA-C @ ?  [facility]  H/O IDA - requiring multi Fe infusions - does have some sort of hypersensitivity to transfusion Mild hypernatremia Hypervitaminosis D Mild neutropenia  Return to care in: Future Appointments  Date Time Provider Department Center  01/23/2024  3:30 PM Seldon Dago, LCSW [facility] None  02/03/2024  3:00 PM Jonasia Coiner, DO [facility] None   Patient was given contact information for behavioral health clinic and was instructed to call 911 for emergencies.    Patient and plan of care will be discussed with the Attending MD, who agrees with the above statement and plan.   Subjective:  Chief Complaint:  Chief Complaint  Patient presents with   Medication Management   Depression   Anxiety   Addiction Problem   Interval History:   PDMP - 08/20/2023 gabapentin  300 mg 30 QTY for 15 days  Unaccompanied.  Current Rx: Naltrexone  50 mg Remeron  30 mg Ran out of gabapentin , was taking it sporadically, unsure if helpful  Mood: "not good", nothing has changed. She is still depressed and anxious, feels overwhelmed and angry.  Denied active and passive SI. Upset because she feels that no one is helping her and that she has been out of work for a year now.   Appetite: "same, not there", no weight changes  Sleep: "Poor", no change. -still sleeping throughout the day and tossing and turning at night.  -restless  legs have improved since transfusion, but restlessness still present now. Gabapentin  she thinks helped with the restlessness.   EtOH daily Longest bout of no drinking was 2 days, experienced tremors that self-resolved.  Unsure of how much she is drinking, but still drinking daily. Eye opener. Drinking more than when last saw her.  Feels that drinking is not a problem, that her problem is her mental health. Psychoeducation provided on etoh and effects on mood and how medications are not effective while drinking.  Feels that drinking is the only thing that helps her.   Therapy-declined, recommended AA, sober sisters, cdiop, declined.  Recommended residential rehab or at least detox. She said she can't because she has children at home that are minors, youngest 42 yo and 42yo. Denied that they would be able to stay with anyone like family, biological dad. No family lives in town.   Patient amenable to med changes per above after discussing the risks, benefits, and side effects. Otherwise patient had no other questions or concerns and was amenable to plan per above.  Safety: Denied active and passive SI, HI, AVH, paranoia. Patient is aware of [facility], 988 and 911 as well.   She was also made aware that because of the amount of no-shows she has had, that if she were to miss the next appointment, that she would be dismissed from the clinic.  Review of Systems  Constitutional:  Positive for fatigue.  Respiratory:  Negative for shortness of breath.   Cardiovascular:  Negative for chest pain.  Gastrointestinal:  Negative for abdominal pain, constipation and diarrhea.  Neurological:  Positive for tremors. Negative for dizziness, seizures and light-headedness.   Called patient after staffing with attending the same day: Stated that patient needs to come in person for the next appointment and that we are holding the gabapentin  until she is adherent with recommendations for substance use--rehab, detox,  therapy, group, aa, cdiop, ect. Especially since there is concern for respiratory depression while drinking and using gabapentin . She doesn't want to try a new med, saying that there is no point, nothing has worked in the past and that I am making a big deal about etoh. Discussed the importance of getting out of the house, which in itself is a part of the therapeutic process and will ultimately help to improve her mood. Also that we need vital signs, given etoh will increase BP. We are willing to work with her and scheduling her for afternoons only since she has had difficulties with morning appointments.  Stated that she is not able to make it in person on May 19 because she doesn't have a car. Stated that there is no way for her to make it to the appointment in person. Stated that she can't use the bus system because she doesn't know how nor does she want to learn. Stated that taxi/uber or other car service is not possible either. Denied anyone who can drive her to the appointment. Stated that this is another added stressor to her life that isn't helping. Again went over the importance of behavioral activation and get up to improving mood. Stated that unless she can meet us  half way with treatment, that we may not be the best fit for her. Again re-iterated that she does need to come in person for the next appointment.  Stated that she may be able to come in person in June, but not in May.  Attempted to work with her and rescheduling the appointment to June so she can come in person, but she hung up.   Visit Diagnosis:    ICD-10-CM   1. Alcohol use disorder  F10.90 thiamine (VITAMIN B1) 100 MG tablet    naltrexone  (DEPADE) 50 MG tablet    mirtazapine  (REMERON ) 30 MG tablet    DISCONTINUED: mirtazapine  (REMERON ) 30 MG tablet    DISCONTINUED: gabapentin  (NEURONTIN ) 300 MG capsule    DISCONTINUED: mirtazapine  (REMERON ) 30 MG tablet    DISCONTINUED: mirtazapine  (REMERON ) 30 MG tablet    2. Major  depressive disorder, recurrent severe without psychotic features (HCC)  F33.2 mirtazapine  (REMERON ) 30 MG tablet    DISCONTINUED: mirtazapine  (REMERON ) 30 MG tablet    DISCONTINUED: mirtazapine  (REMERON ) 30 MG tablet    DISCONTINUED: mirtazapine  (REMERON ) 30 MG tablet    3. Generalized anxiety disorder with panic attacks  F41.1 mirtazapine  (REMERON ) 30 MG tablet   F41.0 DISCONTINUED: mirtazapine  (REMERON ) 30 MG tablet    DISCONTINUED: gabapentin  (NEURONTIN ) 300 MG capsule    DISCONTINUED: mirtazapine  (REMERON ) 30 MG tablet    DISCONTINUED: mirtazapine  (REMERON ) 30 MG tablet    4. History of tobacco use disorder  Z87.891 naltrexone  (DEPADE) 50 MG tablet     Past Psychiatric History:  Diagnoses:  MDD, GAD at 43yo, grief, alcohol use disorder, tobacco use disorder, cluster B traits, historical dx of bipolar d/o (however changed and removed due to no evidence  of mania even on ssri, suspect substance induced mood d/o) Medication trials: zoloft  (partial effectiveness at 150mg , non-adherent) Previous psychiatrist/therapist: yes therapy Hospitalizations: none Suicide attempts: none SIB: none Hx of violence towards others: none Current access to guns: none Hx of abuse:  none that she can remember but at age 74 wondered if something happened to her in childhood and dreams about it. Sees herself touching a non-specific man; consistently she is a teenager  Substance Use History: EtOH:  reports current alcohol use of about 5.0 standard drinks of alcohol per week. Nicotine: Black and mild weekly, last time 06/2023 Marijuana: Denied IV drug use: Denied Stimulants: Denied Opiates: Denied Sedative/hypnotics: Denied Hallucinogens: Denied DT: Denied Detox: Denied Residential: Denied Previous: Per above  Past Medical History: Dx:  has a past medical history of Anemia, Anxiety, Bipolar disorder (HCC), Depression, Ectopic pregnancy, Fibroids, Headache, and Retained products of conception after  miscarriage (07/12/2022).  Head trauma: Denied Seizures: Denied Allergies: Venofer  [iron  sucrose]   Family Psychiatric History:  17 and 31 year old children with anger and emotional   Suicide: Sister attempted Homicide: Few uncles Psych hospitalization: littler sister BiPD: grandma SCZ/SCzA: Denied Substance use: Dad and mom   Social History:  Housing: Living with 3 children Income: Unemployed currently Children: 3x Support: Mom Guns/Weapons: Denied Legal: Denied  Past Medical History:  Past Medical History:  Diagnosis Date   Anemia    Anxiety    Bipolar disorder (HCC)    Depression    Ectopic pregnancy    Fibroids    Headache    Retained products of conception after miscarriage 07/12/2022    Past Surgical History:  Procedure Laterality Date   BUNIONECTOMY Left    DILATION AND CURETTAGE OF UTERUS N/A 06/26/2022   Procedure: SUCTION DILATATION AND CURETTAGE;  Surgeon: Wendelyn Halter, MD;  Location: [facility] OR;  Service: Gynecology;  Laterality: N/A;   DILATION AND EVACUATION N/A 07/12/2022   Procedure: DILATATION AND EVACUATION;  Surgeon: Granville Layer, MD;  [facility] OR;  Service: Gynecology;  Laterality: N/A;   Family History:  Family History  Family history unknown: Yes   Social History   Socioeconomic History   Marital status: Single    Spouse name: Not on file   Number of children: Not on file   Years of education: Not on file   Highest education level: Not on file  Occupational History   Not on file  Tobacco Use   Smoking status: Former    Current packs/day: 0.00    Types: Cigarettes    Quit date: 06/2023    Years since quitting: 0.5   Smokeless tobacco: Never   Tobacco comments:    06/25/22- "long time ago."    8/20204-black and mild weekly > last time 06/2023  Vaping Use   Vaping status: Never Used  Substance and Sexual Activity   Alcohol use: Yes    Alcohol/week: 5.0 standard drinks of alcohol    Types: 5 Shots of liquor per week     Comment: sometimes,"I do not count how many at a time". Patient 's sister called triage at [facility] to report that patient had been drinking alot since she lost the baby.  On 10/18/2022 patient endorsed drinking bottle Tequila over the course of each weekend   Drug use: No   Sexual activity: Yes    Birth control/protection: None  Other Topics Concern   Not on file  Social History Narrative   Not on file   Social  Drivers of Corporate investment banker Strain: Not on file  Food Insecurity: Not on file  Transportation Needs: Not on file  Physical Activity: Not on file  Stress: Not on file  Social Connections: Unknown (01/16/2022)   Received from [facility]   Social Network    Social Network: Not on file   Allergies:  Allergies  Allergen Reactions   Venofer  [Iron  Sucrose] Hives and Itching    Pt with hives and itching 45 min after completion of venofer  300mg    Current Medications: Current Outpatient Medications  Medication Sig Dispense Refill   thiamine (VITAMIN B1) 100 MG tablet Take 1 tablet (100 mg total) by mouth daily. 30 tablet 1   mirtazapine  (REMERON ) 30 MG tablet Take 1 tablet (30 mg total) by mouth at bedtime. 30 tablet 1   naltrexone  (DEPADE) 50 MG tablet Take 1 tablet (50 mg total) by mouth at bedtime. 30 tablet 1   naproxen  (NAPROSYN ) 375 MG tablet Take 1 tablet (375 mg total) by mouth 2 (two) times daily as needed for headache. 30 tablet 0   prochlorperazine  (COMPAZINE ) 10 MG tablet Take 0.5-1 tablets (5-10 mg total) by mouth every 6 (six) hours as needed (dizziness or headache). 30 tablet 0   No current facility-administered medications for this visit.   Objective: Psychiatric Specialty Exam: There were no vitals taken for this visit.There is no height or weight on file to calculate BMI.  General Appearance: Casual, fairly groomed, disheveled hair   Eye Contact:  Fair    Speech:  Clear, coherent, normal rate   Volume:  Normal   Mood: see  above  Affect:  Appropriate, congruent, restricted, withdrawn and melancholic appearing  Thought Content: Logical, rumination  Suicidal Thoughts: Denied active and passive SI    Thought Process:  Coherent, goal-directed, linear   Orientation:  A&Ox4   Memory:  Immediate good  Judgment:  Poor  Insight:  Poor  Concentration:  Attention and concentration good   Recall:  Good  Fund of Knowledge: Good  Language: Good, fluent  Psychomotor Activity: decreased  Akathisia:  NA   AIMS (if indicated): NA   Assets:   Communication Skills Desire for Improvement Housing Leisure Time  ADL's:  Intact  Cognition: WNL  Sleep:  see above    Physical Exam Vitals and nursing note reviewed.  Constitutional:      General: She is awake. She is not in acute distress.    Appearance: She is not toxic-appearing or diaphoretic.  HENT:     Head: Normocephalic and atraumatic.  Eyes:     Conjunctiva/sclera: Conjunctivae normal.  Pulmonary:     Effort: Pulmonary effort is normal. No respiratory distress.  Neurological:     Mental Status: She is alert and oriented to person, place, and time.   Metabolic Disorder Labs: No results found for: "HGBA1C", "MPG" No results found for: "PROLACTIN" No results found for: "CHOL", "TRIG", "HDL", "CHOLHDL", "VLDL", "LDLCALC" Lab Results  Component Value Date   TSH 4.040 10/02/2023   Therapeutic Level Labs: No results found for: "LITHIUM" No results found for: "VALPROATE" No results found for: "CBMZ"  Screenings: GAD-7    Flowsheet Row Office Visit from 08/02/2022 in [facility] at [facility]  Total GAD-7 Score 17      PHQ2-9    Flowsheet Row Counselor from 08/28/2023 in [facility] Office Visit from 10/18/2022 in [facility] at [facility] Visit from  08/02/2022 in [facility] at [facility]  PHQ-2 Total Score 6 6 4   PHQ-9 Total Score 22 15 15        Flowsheet Row Counselor from 08/28/2023 in [facility] ED from 08/20/2023 in [facility] Emergency Department at [facility] ED from 07/15/2023 in [facility] Emergency Department at [facility]  C-SSRS RISK CATEGORY Error: Question 2 not populated No Risk No Risk      Patient/Guardian was advised Release of Information must be obtained prior to any record release in order to collaborate their care with an outside provider. Patient/Guardian was advised if they have not already done so to contact the registration department to sign all necessary forms in order for us  to release information regarding their care.   Consent: Patient/Guardian gives verbal consent for treatment and assignment of benefits for services provided during this visit. Patient/Guardian expressed understanding and agreed to proceed.   Andra Matsuo, DO Psych Resident, PGY-3

## 2024-01-16 NOTE — Addendum Note (Signed)
 Addended by: Donnelly Gainer on: 01/16/2024 08:51 AM   Modules accepted: Level of Service

## 2024-01-23 ENCOUNTER — Ambulatory Visit (INDEPENDENT_AMBULATORY_CARE_PROVIDER_SITE_OTHER): Admitting: Licensed Clinical Social Worker

## 2024-01-23 ENCOUNTER — Encounter (HOSPITAL_COMMUNITY): Payer: Self-pay | Admitting: Licensed Clinical Social Worker

## 2024-01-23 DIAGNOSIS — F332 Major depressive disorder, recurrent severe without psychotic features: Secondary | ICD-10-CM | POA: Diagnosis not present

## 2024-01-23 DIAGNOSIS — F410 Panic disorder [episodic paroxysmal anxiety] without agoraphobia: Secondary | ICD-10-CM | POA: Diagnosis not present

## 2024-01-23 DIAGNOSIS — F1090 Alcohol use, unspecified, uncomplicated: Secondary | ICD-10-CM | POA: Diagnosis not present

## 2024-01-23 DIAGNOSIS — F411 Generalized anxiety disorder: Secondary | ICD-10-CM | POA: Diagnosis not present

## 2024-01-23 NOTE — Progress Notes (Signed)
 Virtual Visit via Video Note  I connected with Lindsey Hunt on 01/23/24 at  3:30 PM EDT by a video enabled telemedicine application and verified that I am speaking with the correct person using two identifiers.  Location: Patient: home Provider: home office   I discussed the limitations of evaluation and management by telemedicine and the availability of in person appointments. The patient expressed understanding and agreed to proceed.     I discussed the assessment and treatment plan with the patient. The patient was provided an opportunity to ask questions and all were answered. The patient agreed with the plan and demonstrated an understanding of the instructions.   The patient was advised to call back or seek an in-person evaluation if the symptoms worsen or if the condition fails to improve as anticipated.  I provided 45 minutes of non-face-to-face time during this encounter.   Seldon Dago, LCSW   THERAPIST PROGRESS NOTE  Session Time: 3:30pm-4:25pm  Participation Level: Active  Behavioral Response: CasualLethargicDepressed and Irritable  Type of Therapy: Individual Therapy  Treatment Goals addressed: depression, alcohol use disorder  ProgressTowards Goals: Initial  Interventions: CBT  Summary: Lindsey Hunt is a 43 y.o. female who presents with MDD, GAD, Alcohol use disorder.   Suicidal/Homicidal: Nowithout intent/plan  Therapist Response: Lindsey Hunt engaged well in individual virtual session with clinician. Clinician reviewed sxs and built rapport. Clinician provided CBT therapy to assess thoughts, feelings, and behaviors. Clinician reflected significant depressive sxs, noting lethargy, refusal to come out of her room, lack of motivation, high irritability levels. She shared that she will drink up to 5-6 shots of tequila every night when she has it. She feels the urge to drink when it is there, but does not drink when it is unavailable. Clinician  explored readiness to change and willingness to participate in therapy. Clinician provided homework of getting medication into her system and removing alcohol.   Plan: Return again in 2-4 weeks. Explored SA counseling as option if drinking continues to be a problem  Diagnosis: Major depressive disorder, recurrent severe without psychotic features (HCC)  Alcohol use disorder  Generalized anxiety disorder with panic attacks  Collaboration of Care: Medication Management AEB reviewed med management note  Patient/Guardian was advised Release of Information must be obtained prior to any record release in order to collaborate their care with an outside provider. Patient/Guardian was advised if they have not already done so to contact the registration department to sign all necessary forms in order for us  to release information regarding their care.   Consent: Patient/Guardian gives verbal consent for treatment and assignment of benefits for services provided during this visit. Patient/Guardian expressed understanding and agreed to proceed.   Merleen Stare [place], LCSW 01/23/2024

## 2024-02-03 ENCOUNTER — Ambulatory Visit (HOSPITAL_COMMUNITY): Admitting: Student

## 2024-02-17 ENCOUNTER — Encounter: Payer: Self-pay | Admitting: Hematology and Oncology

## 2024-02-17 NOTE — Progress Notes (Signed)
 This encounter was created in error - please disregard.

## 2024-02-20 ENCOUNTER — Encounter (HOSPITAL_COMMUNITY): Payer: Self-pay | Admitting: Licensed Clinical Social Worker

## 2024-02-20 ENCOUNTER — Encounter (HOSPITAL_COMMUNITY): Payer: Self-pay

## 2024-02-20 ENCOUNTER — Ambulatory Visit (INDEPENDENT_AMBULATORY_CARE_PROVIDER_SITE_OTHER): Payer: Self-pay | Admitting: Licensed Clinical Social Worker

## 2024-02-20 DIAGNOSIS — Z0389 Encounter for observation for other suspected diseases and conditions ruled out: Secondary | ICD-10-CM

## 2024-02-20 NOTE — Progress Notes (Signed)
 No show for therapy

## 2024-02-24 ENCOUNTER — Telehealth (HOSPITAL_COMMUNITY): Payer: Self-pay | Admitting: Student

## 2024-02-24 ENCOUNTER — Other Ambulatory Visit (HOSPITAL_COMMUNITY): Payer: Self-pay | Admitting: Student

## 2024-02-24 ENCOUNTER — Encounter (HOSPITAL_COMMUNITY): Payer: Self-pay | Admitting: Student

## 2024-02-24 ENCOUNTER — Ambulatory Visit (HOSPITAL_COMMUNITY): Admitting: Student

## 2024-02-24 DIAGNOSIS — F1020 Alcohol dependence, uncomplicated: Secondary | ICD-10-CM

## 2024-02-24 DIAGNOSIS — F332 Major depressive disorder, recurrent severe without psychotic features: Secondary | ICD-10-CM

## 2024-02-24 DIAGNOSIS — F410 Panic disorder [episodic paroxysmal anxiety] without agoraphobia: Secondary | ICD-10-CM

## 2024-02-24 DIAGNOSIS — Z87891 Personal history of nicotine dependence: Secondary | ICD-10-CM

## 2024-02-24 MED ORDER — THIAMINE HCL 100 MG PO TABS: 100.0000 mg | ORAL_TABLET | Freq: Every day | ORAL | 0 refills | Status: AC

## 2024-02-24 MED ORDER — NALTREXONE HCL 50 MG PO TABS: 50.0000 mg | ORAL_TABLET | Freq: Every day | ORAL | 0 refills | Status: AC

## 2024-02-24 MED ORDER — MIRTAZAPINE 30 MG PO TABS: 30.0000 mg | ORAL_TABLET | Freq: Every day | ORAL | 0 refills | Status: AC

## 2024-02-24 NOTE — Telephone Encounter (Incomplete)
 Patient was called to make aware of being dismissed due to multiple missed appointment per Dr. Nguyen. Patient started to raise her voice on the phone and stated, "she can not dismiss me" and that she will sue the company if she is not seen at our office. Patient began to curse on the phone stating "what is wrong with you m***** f******; ya'll got me f***** up. Lindsey Hunt better get to referring or else I'm suing all you m***** f******". I'm already going through s*** and now yall doing this.Patient has been sent dismissal letter via certified mail and mychart along with a list of psychiatrists she can reach out to. Patient was told to contact her insurance company to find a provider in network as well.

## 2024-02-24 NOTE — Telephone Encounter (Signed)
 Patient no-showed today's appointment (02/24/2024).  Patient to be dismissed from practice.  It was made clear to her over a month ago (last encounter 01/12/3034) that if she no-showed this appointment that she would be dismissed from the practice due to multple (>3 no-shows since 09/2023)  Front desk will be sending dismissal letter. I will send in 30d refill for her meds as bridge until she finds a new psychiatrist

## 2024-03-02 ENCOUNTER — Ambulatory Visit (HOSPITAL_COMMUNITY): Admitting: Student

## 2024-06-03 ENCOUNTER — Ambulatory Visit
Admission: RE | Admit: 2024-06-03 | Discharge: 2024-06-03 | Disposition: A | Discharge status: Home / Self Care | Source: Ambulatory Visit | Attending: Family Medicine | Admitting: Family Medicine

## 2024-06-03 VITALS — BP 131/91 | HR 72 | Temp 98.2°F | Resp 20 | Ht 66.0 in | Wt 200.2 lb

## 2024-06-03 DIAGNOSIS — G8929 Other chronic pain: Secondary | ICD-10-CM | POA: Insufficient documentation

## 2024-06-03 DIAGNOSIS — J069 Acute upper respiratory infection, unspecified: Secondary | ICD-10-CM | POA: Diagnosis not present

## 2024-06-03 DIAGNOSIS — J00 Acute nasopharyngitis [common cold]: Secondary | ICD-10-CM

## 2024-06-03 DIAGNOSIS — R6889 Other general symptoms and signs: Secondary | ICD-10-CM | POA: Diagnosis not present

## 2024-06-03 DIAGNOSIS — R062 Wheezing: Secondary | ICD-10-CM

## 2024-06-03 LAB — POC COVID19/FLU A&B COMBO
Covid Antigen, POC: NEGATIVE
Influenza A Antigen, POC: NEGATIVE
Influenza B Antigen, POC: NEGATIVE

## 2024-06-03 MED ORDER — PROMETHAZINE-DM 6.25-15 MG/5ML PO SYRP: 5.0000 mL | ORAL_SOLUTION | Freq: Four times a day (QID) | ORAL | 0 refills | Status: AC | PRN

## 2024-06-03 MED ORDER — PREDNISONE 20 MG PO TABS: 40.0000 mg | ORAL_TABLET | Freq: Every day | ORAL | 0 refills | Status: AC

## 2024-06-03 NOTE — ED Triage Notes (Signed)
 Patient reports symptoms starting Sunday with Congestion/runny nose. Next day sore throat, cough, all these symptoms remain with fatigue/body aches. No fever known.

## 2024-06-03 NOTE — Discharge Instructions (Signed)
 Results for orders placed or performed during the hospital encounter of 06/03/24  POC Covid19/Flu A&B Antigen   Collection Time: 06/03/24  1:25 PM  Result Value Ref Range   Influenza A Antigen, POC Negative Negative   Influenza B Antigen, POC Negative Negative   Covid Antigen, POC Negative Negative

## 2024-06-03 NOTE — ED Provider Notes (Signed)
 [facility]   [id number] 06/03/24 Arrival Time: 1204  ASSESSMENT & PLAN:  1. Common cold   2. Not feeling great   3. Viral URI with cough   4. Wheezing    Discussed typical duration of likely viral illness. Results for orders placed or performed during the hospital encounter of 06/03/24  POC Covid19/Flu A&B Antigen   Collection Time: 06/03/24  1:25 PM  Result Value Ref Range   Influenza A Antigen, POC Negative Negative   Influenza B Antigen, POC Negative Negative   Covid Antigen, POC Negative Negative   OTC symptom care as needed.  Meds ordered this encounter  Medications   predniSONE  (DELTASONE ) 20 MG tablet    Sig: Take 2 tablets (40 mg total) by mouth daily.    Dispense:  10 tablet    Refill:  0   promethazine -dextromethorphan (PROMETHAZINE -DM) 6.25-15 MG/5ML syrup    Sig: Take 5 mLs by mouth 4 (four) times daily as needed for cough.    Dispense:  118 mL    Refill:  0     Follow-up Information     Toepfer, Olivia, PA-C.   Specialty: Physician Assistant Why: As needed. Contact information: [address] [place] [address] [phone]                 Reviewed expectations re: course of current medical issues. Questions answered. Outlined signs and symptoms indicating need for more acute intervention. Understanding verbalized. After Visit Summary given.   SUBJECTIVE: History from: Patient. Lindsey Hunt is a 43 y.o. female. Patient reports symptoms starting Sunday with Congestion/runny nose. Next day sore throat, cough, all these symptoms remain with fatigue/body aches. No fever known.  Denies: fever. Normal PO intake without n/v/d.  OBJECTIVE:  Vitals:   06/03/24 1250 06/03/24 1253  BP:  (!) 131/91  Pulse:  72  Resp:  20  Temp:  98.2 F (36.8 C)  TempSrc:  Oral  SpO2:  97%  Weight: 90.8 kg   Height: 5' 6 (1.676 m)     General appearance: alert; no distress Eyes: PERRLA; EOMI; conjunctiva normal HENT: [place];  AT; with nasal congestion; throat cobblestoning Neck: supple  Lungs: speaks full sentences without difficulty; unlabored; mild bilat exp wheezing; dry cough Extremities: no edema Skin: warm and dry Neurologic: normal gait Psychological: alert and cooperative; normal mood and affect  Labs: Results for orders placed or performed during the hospital encounter of 06/03/24  POC Covid19/Flu A&B Antigen   Collection Time: 06/03/24  1:25 PM  Result Value Ref Range   Influenza A Antigen, POC Negative Negative   Influenza B Antigen, POC Negative Negative   Covid Antigen, POC Negative Negative   Labs Reviewed  POC COVID19/FLU A&B COMBO - Normal    Imaging: No results found.  Allergies  Allergen Reactions   Venofer  [Iron  Sucrose] Hives and Itching    Pt with hives and itching 45 min after completion of venofer  300mg     Past Medical History:  Diagnosis Date   Anemia    Anxiety    Bipolar disorder (HCC)    Depression    Ectopic pregnancy    Fibroids    Headache    Retained products of conception after miscarriage 07/12/2022   Social History   Socioeconomic History   Marital status: Single    Spouse name: Not on file   Number of children: Not on file   Years of education: Not on file   Highest education level: Not  on file  Occupational History   Not on file  Tobacco Use   Smoking status: Former    Current packs/day: 0.00    Types: Cigarettes    Quit date: 06/2023    Years since quitting: 0.9   Smokeless tobacco: Never   Tobacco comments:    06/25/22- long time ago.    8/20204-black and mild weekly > last time 06/2023  Vaping Use   Vaping status: Never Used  Substance and Sexual Activity   Alcohol use: Yes    Alcohol/week: 5.0 standard drinks of alcohol    Types: 5 Shots of liquor per week    Comment: sometimes,I do not count how many at a time. Patient 's sister called triage at [facility] to report that patient had been drinking alot since she lost the baby.   On 10/18/2022 patient endorsed drinking bottle Tequila over the course of each weekend   Drug use: No   Sexual activity: Yes    Birth control/protection: None  Other Topics Concern   Not on file  Social History Narrative   Not on file   Social Drivers of Health   Financial Resource Strain: Not on file  Food Insecurity: Not on file  Transportation Needs: Not on file  Physical Activity: Not on file  Stress: Not on file  Social Connections: Unknown (01/16/2022)   Received from [facility]   Social Network    Social Network: Not on file  Intimate Partner Violence: Unknown (12/21/2021)   Received from [facility]   HITS    Physically Hurt: Not on file    Insult or Talk Down To: Not on file    Threaten Physical Harm: Not on file    Scream or Curse: Not on file   Family History  Family history unknown: Yes   Past Surgical History:  Procedure Laterality Date   BUNIONECTOMY Left    DILATION AND CURETTAGE OF UTERUS N/A 06/26/2022   Procedure: SUCTION DILATATION AND CURETTAGE;  Surgeon: Jayne Vonn DEL, MD;  Location: [facility] OR;  Service: Gynecology;  Laterality: N/A;   DILATION AND EVACUATION N/A 07/12/2022   Procedure: DILATATION AND EVACUATION;  Surgeon: Fredirick Glenys RAMAN, MD;  Location: [facility] OR;  Service: Gynecology;  Laterality: N/AMERL Rolinda Rogue, MD 06/03/24 1353

## 2024-08-24 ENCOUNTER — Emergency Department
Admission: EM | Admit: 2024-08-24 | Discharge: 2024-08-24 | Disposition: A | Discharge status: Home / Self Care | Attending: Emergency Medicine | Admitting: Emergency Medicine

## 2024-08-24 ENCOUNTER — Other Ambulatory Visit: Payer: Self-pay

## 2024-08-24 ENCOUNTER — Emergency Department

## 2024-08-24 DIAGNOSIS — R079 Chest pain, unspecified: Secondary | ICD-10-CM | POA: Diagnosis present

## 2024-08-24 DIAGNOSIS — R0789 Other chest pain: Secondary | ICD-10-CM | POA: Diagnosis not present

## 2024-08-24 LAB — CBC
HCT: 39.6 % (ref 36.0–46.0)
Hemoglobin: 12.4 g/dL (ref 12.0–15.0)
MCH: 28 pg (ref 26.0–34.0)
MCHC: 31.3 g/dL (ref 30.0–36.0)
MCV: 89.4 fL (ref 80.0–100.0)
Platelets: 336 K/uL (ref 150–400)
RBC: 4.43 MIL/uL (ref 3.87–5.11)
RDW: 14.2 % (ref 11.5–15.5)
WBC: 4.7 K/uL (ref 4.0–10.5)
nRBC: 0 % (ref 0.0–0.2)

## 2024-08-24 LAB — BASIC METABOLIC PANEL WITH GFR
Anion gap: 14 (ref 5–15)
BUN: 11 mg/dL (ref 6–20)
CO2: 22 mmol/L (ref 22–32)
Calcium: 9.5 mg/dL (ref 8.9–10.3)
Chloride: 103 mmol/L (ref 98–111)
Creatinine, Ser: 0.75 mg/dL (ref 0.44–1.00)
GFR, Estimated: >60 mL/min (ref >60)
Glucose, Bld: 100 mg/dL — ABNORMAL HIGH (ref 70–99)
Potassium: 3.6 mmol/L (ref 3.5–5.1)
Sodium: 139 mmol/L (ref 135–145)

## 2024-08-24 LAB — TROPONIN T, HIGH SENSITIVITY
Troponin T High Sensitivity: <15 ng/L (ref 0–19)
Troponin T High Sensitivity: <15 ng/L (ref 0–19)

## 2024-08-24 MED ORDER — LIDOCAINE VISCOUS HCL 2 % MT SOLN
15.0000 mL | Freq: Once | OROMUCOSAL | Status: AC
  Administered 2024-08-24: 15 mL via ORAL
  Filled 2024-08-24: qty 15

## 2024-08-24 MED ORDER — FAMOTIDINE 20 MG PO TABS
20.0000 mg | ORAL_TABLET | Freq: Once | ORAL | Status: AC
  Administered 2024-08-24: 20 mg via ORAL
  Filled 2024-08-24: qty 1

## 2024-08-24 MED ORDER — ALUM & MAG HYDROXIDE-SIMETH 200-200-20 MG/5ML PO SUSP
30.0000 mL | Freq: Once | ORAL | Status: AC
  Administered 2024-08-24: 30 mL via ORAL
  Filled 2024-08-24: qty 30

## 2024-08-24 MED ORDER — ACETAMINOPHEN 500 MG PO TABS
1000.0000 mg | ORAL_TABLET | Freq: Once | ORAL | Status: AC
  Administered 2024-08-24: 1000 mg via ORAL
  Filled 2024-08-24: qty 2

## 2024-08-24 MED ORDER — LIDOCAINE 5 % EX PTCH
1.0000 | MEDICATED_PATCH | CUTANEOUS | Status: DC
  Administered 2024-08-24: 1 via TRANSDERMAL
  Filled 2024-08-24: qty 1

## 2024-08-24 NOTE — Discharge Instructions (Signed)
 You are seen in the emergency department for left-sided chest pain.  You had a normal EKG.  You were troponin (heart enzyme) was undetectable and do not believe you are having a heart attack today.  Your chest x-ray did not show any signs of pneumonia.  Your vital signs were otherwise normal.  I have a very low suspicion that you have a blood clot.  You can alternate Motrin  and Tylenol  for pain control.  You can use over-the-counter Lidoderm  patches, keep on for 12 hours and then remove.  Follow-up closely with your primary care physician.  Return to the emergency department if you have any ongoing or worsening symptoms.  Pain control:  Ibuprofen  (motrin /aleve /advil ) - You can take 3 tablets (600 mg) every 6 hours as needed for pain/fever.  Acetaminophen  (tylenol ) - You can take 2 extra strength tablets (1000 mg) every 6 hours as needed for pain/fever.  You can alternate these medications or take them together.  Make sure you eat food/drink water when taking these medications.

## 2024-08-24 NOTE — ED Provider Notes (Signed)
 [facility] Provider Note    Event Date/Time   First MD Initiated Contact with Patient 08/24/24 2100     (approximate)   History   Chest Pain   HPI  Lindsey Hunt is a 43 y.o. female no significant past medical history presents to the emergency department for left-sided chest pain.  Chest pain started last night and then has been ongoing and worsening today.  Left-sided chest pain that is worse with movement.  Denies any shortness of breath.  Denies significant nausea or vomiting.  No abdominal pain.  Took some Tums earlier because she thought it might be acid reflux.  No heavy lifting.  No history of DVT or PE.  Denies concern for pregnancy.  Denies any recent travel or surgeries.  Does not take any estrogen supplements.  No family history of coronary artery disease at a young age.  No signs [place] weight loss.  No exertional chest pain.     Physical Exam   Triage Vital Signs: ED Triage Vitals  Encounter Vitals Group     BP 08/24/24 1853 (!) 145/96     Girls Systolic BP Percentile --      Girls Diastolic BP Percentile --      Boys Systolic BP Percentile --      Boys Diastolic BP Percentile --      Pulse Rate 08/24/24 1853 60     Resp 08/24/24 1853 18     Temp 08/24/24 1853 98.2 F (36.8 C)     Temp src --      SpO2 08/24/24 1853 99 %     Weight 08/24/24 1855 170 lb (77.1 kg)     Height 08/24/24 1855 5' 6 (1.676 m)     Head Circumference --      Peak Flow --      Pain Score 08/24/24 1853 10     Pain Loc --      Pain Education --      Exclude from Growth Chart --     Most recent vital signs: Vitals:   08/24/24 1853 08/24/24 2236  BP: (!) 145/96 (!) 148/101  Pulse: 60 80  Resp: 18 16  Temp: 98.2 F (36.8 C) 98 F (36.7 C)  SpO2: 99% 99%    Physical Exam Constitutional:      Appearance: She is well-developed.  HENT:     Head: Atraumatic.  Eyes:     Conjunctiva/sclera: Conjunctivae normal.  Cardiovascular:     Rate and  Rhythm: Regular rhythm.     Pulses:          Radial pulses are 2+ on the right side and 2+ on the left side.       Dorsalis pedis pulses are 2+ on the right side and 2+ on the left side.     Heart sounds: Normal heart sounds. No murmur heard. Pulmonary:     Effort: No respiratory distress.  Chest:     Chest wall: Tenderness present.     Comments: Tender palpation to the left lateral chest wall in the left trapezius muscles Abdominal:     General: There is no distension.     Palpations: Abdomen is soft.     Tenderness: There is no abdominal tenderness.  Musculoskeletal:        General: Normal range of motion.     Cervical back: Normal range of motion.     Right lower leg: No edema.     Left  lower leg: No edema.     Comments: No unilateral leg  Skin:    General: Skin is warm.  Neurological:     Mental Status: She is alert. Mental status is at baseline.     IMPRESSION / MDM / ASSESSMENT AND PLAN / ED COURSE  I reviewed the triage vital signs and the nursing notes.  Differential diagnosis including musculoskeletal strain, ACS, anemia, gastritis/PUD, pneumonia, pleurisy.  Low suspicion for pulmonary embolism, low risk Wells criteria and PERC negative   EKG  I, Clotilda Punter, the attending physician, personally viewed and interpreted this ECG.  EKG showed normal sinus rhythm.  Normal intervals and no chamber enlargement.  No significant ST elevation or depression.  No findings of acute ischemia or dysrhythmia.  No tachycardic or bradycardic dysrhythmias while on cardiac telemetry.  RADIOLOGY I independently reviewed imaging, my interpretation of imaging: Chest x-ray no signs of pneumonia.  LABS (all labs ordered are listed, but only abnormal results are displayed) Labs interpreted as -    Labs Reviewed  BASIC METABOLIC PANEL WITH GFR - Abnormal; Notable for the following components:      Result Value   Glucose, Bld 100 (*)    All other components within normal limits   CBC  POC URINE PREG, ED  TROPONIN T, HIGH SENSITIVITY  TROPONIN T, HIGH SENSITIVITY     MDM  Patient was given Tylenol  and GI cocktail  I have a low suspicion for ACS, serial troponins are undetectable and has a low risk heart score.  Have a very low suspicion for pulmonary embolism, low risk Wells criteria and PERC negative.  Abdomen nontender palpation have a low suspicion for referred pain from her gallbladder.  No right upper quadrant abdominal tenderness to palpation  I have a low suspicion for acute pericarditis, no positional change, no pericardial effusion on bedside cardiac ultrasound and no findings consistent with EKG.  Most likely with musculoskeletal given that she has some reproducible tenderness to palpation.  Given a Lidoderm  patch.  Discussed symptomatic treatment and multimodal pain control.  Discussed return to the emergency department if she had any ongoing or worsening symptoms or any other new concerning symptoms.  Discussed close follow-up with her primary care physician.  No questions or concerns at time of discharge.     PROCEDURES:  Critical Care performed: No  Ultrasound ED Echo  Date/Time: 08/24/2024 11:04 PM  Performed by: Punter Clotilda, MD Authorized by: Punter Clotilda, MD   Procedure details:    Indications: chest pain     Views: subxiphoid, parasternal long axis view, parasternal short axis view and apical 4 chamber view     Images: not archived     Limitations:  Acoustic shadowing Findings:    Pericardium: no pericardial effusion     LV Function: normal (>50% EF)   Impression:    Impression: normal     Patient's presentation is most consistent with acute presentation with potential threat to life or bodily function.   MEDICATIONS ORDERED IN ED: Medications  lidocaine  (LIDODERM ) 5 % 1 patch (1 patch Transdermal Patch Applied 08/24/24 2240)  famotidine  (PEPCID ) tablet 20 mg (20 mg Oral Given 08/24/24 2232)  alum & mag hydroxide-simeth  (MAALOX/MYLANTA) 200-200-20 MG/5ML suspension 30 mL (30 mLs Oral Given 08/24/24 2232)    And  lidocaine  (XYLOCAINE ) 2 % viscous mouth solution 15 mL (15 mLs Oral Given 08/24/24 2232)  acetaminophen  (TYLENOL ) tablet 1,000 mg (1,000 mg Oral Given 08/24/24 2232)    FINAL CLINICAL  IMPRESSION(S) / ED DIAGNOSES   Final diagnoses:  Nonspecific chest pain     Rx / DC Orders   ED Discharge Orders     None        Note:  This document was prepared using Dragon voice recognition software and may include unintentional dictation errors.   Suzanne Kirsch, MD 08/24/24 [phone]

## 2024-08-24 NOTE — ED Triage Notes (Signed)
 Pt comes with cp that started yesterday and worse today. Pt state it is taking her breath.

## 2024-08-24 NOTE — ED Notes (Signed)
Blue top tube sent to the lab at this time
